# Patient Record
Sex: Male | Born: 2000 | Race: White | Hispanic: Yes | Marital: Single | Smoking: Current every day smoker
Health system: Southern US, Community
[De-identification: ages and names within clinical notes are randomized; demographics above are authoritative.]

## PROBLEM LIST (undated history)

## (undated) DIAGNOSIS — E669 Obesity, unspecified: Secondary | ICD-10-CM

## (undated) DIAGNOSIS — F909 Attention-deficit hyperactivity disorder, unspecified type: Secondary | ICD-10-CM

## (undated) HISTORY — PX: TONSILLECTOMY: SUR1361

---
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
## 2020-08-16 ENCOUNTER — Other Ambulatory Visit: Payer: Self-pay

## 2020-08-16 ENCOUNTER — Encounter (HOSPITAL_COMMUNITY): Payer: Self-pay

## 2020-08-16 ENCOUNTER — Ambulatory Visit (INDEPENDENT_AMBULATORY_CARE_PROVIDER_SITE_OTHER): Payer: Medicaid Other

## 2020-08-16 ENCOUNTER — Ambulatory Visit (HOSPITAL_COMMUNITY)
Admission: EM | Admit: 2020-08-16 | Discharge: 2020-08-16 | Disposition: A | Discharge status: Home / Self Care | Payer: Medicaid Other | Attending: Emergency Medicine | Admitting: Emergency Medicine

## 2020-08-16 DIAGNOSIS — S61402A Unspecified open wound of left hand, initial encounter: Secondary | ICD-10-CM | POA: Diagnosis not present

## 2020-08-16 DIAGNOSIS — T148XXA Other injury of unspecified body region, initial encounter: Secondary | ICD-10-CM

## 2020-08-16 DIAGNOSIS — S61432A Puncture wound without foreign body of left hand, initial encounter: Secondary | ICD-10-CM | POA: Diagnosis not present

## 2020-08-16 DIAGNOSIS — W60XXXA Contact with nonvenomous plant thorns and spines and sharp leaves, initial encounter: Secondary | ICD-10-CM | POA: Diagnosis not present

## 2020-08-16 MED ORDER — IBUPROFEN 800 MG PO TABS
800.0000 mg | ORAL_TABLET | Freq: Once | ORAL | Status: AC
  Administered 2020-08-16: 800 mg via ORAL

## 2020-08-16 MED ORDER — IBUPROFEN 800 MG PO TABS
ORAL_TABLET | ORAL | Status: AC
  Filled 2020-08-16: qty 1

## 2020-08-16 MED ORDER — AMOXICILLIN-POT CLAVULANATE 875-125 MG PO TABS: 1.0000 | ORAL_TABLET | Freq: Two times a day (BID) | ORAL | 0 refills | Status: DC

## 2020-08-16 NOTE — Discharge Instructions (Signed)
Keep steri strips on as long as possible, ideally 5 days at least.  They will eventually peel off on their own.  May get wet but do not soak, if wet pat dry and allow to air dry before covering again.   Once they are off cleanse daily with soap and water and then keep covered to keep clean.  Follow up with Dr. Amanda Pea for any worsening of symptoms, or return if needed.  Complete course of antibiotics.

## 2020-08-16 NOTE — ED Triage Notes (Signed)
Pt reports being punctured by a tree branch today. Pt presents with a puncture wound to the left palm. Pt states he felts his left arms numb. Pt states he feels a stinging sensation.

## 2020-08-16 NOTE — ED Provider Notes (Signed)
[facility]    CSN: [id number] Arrival date & time: 08/16/20  1522      History   Chief Complaint Chief Complaint  Patient presents with  . Puncture Wound    HPI [name] is a 20 y.o. male.   [name] presents with complaints of puncture wounds to left palm of hand. While playing with his dog in the back yard he reached his hand around, accidentally impaling it with a tree branch he didn't see. He indicates he feels it penetrated approximately 1 cm in depth. He had to pull it out. Uncertain if any broke off into the palm. Pain caused him to vomit. Full ROM to left hand and thumb still. No active bleeding. States he has had a Tdap in the past 5 years. Has had left hand and carpal tunnel release with I&D in the past with dr. Amanda Pea.     ROS per HPI, negative if not otherwise mentioned.      Past Medical History:  Diagnosis Date  . Attention deficit hyperactivity disorder (ADHD)    followed by Dr. Inda Coke  . Obesity     Patient Active Problem List   Diagnosis Date Noted  . Tinea corporis 10/08/2017  . Inguinal lymphadenopathy 01/13/2015  . Sleep disorder 05/22/2013  . Obesity 04/01/2013  . Attention deficit hyperactivity disorder (ADHD)     Past Surgical History:  Procedure Laterality Date  . NERVE, TENDON AND ARTERY REPAIR Left 04/08/2013   Procedure: Incision and drainage of left hand wound, left carpal tunnel release, hook of hamate excision;  Surgeon: Dominica Severin, MD;  Location: [facility] OR;  Service: Orthopedics;  Laterality: Left;  . TONSILLECTOMY         Home Medications    Prior to Admission medications   Medication Sig Start Date End Date Taking? Authorizing Provider  amoxicillin-clavulanate (AUGMENTIN) 875-125 MG tablet Take 1 tablet by mouth every 12 (twelve) hours. 08/16/20  Yes Fitz Matsuo, Dorene Grebe B, NP  cloNIDine HCl (KAPVAY) 0.1 MG TB12 ER tablet Take 1 tab by mouth every evening and 1 tab every morning.  After 7 days may increase  to 2 tabs qhs and 1 tab qam Patient not taking: Reported on 10/08/2017 11/16/15   Leatha Gilding, MD  ibuprofen (ADVIL,MOTRIN) 400 MG tablet Take 1 tablet (400 mg total) by mouth every 6 (six) hours as needed. Patient not taking: Reported on 10/08/2017 12/22/15   Ward, Chase Picket, PA-C  Lisdexamfetamine Dimesylate (VYVANSE) 10 MG CAPS Take one cap by mouth every morning Patient not taking: Reported on 10/08/2017 11/18/15   Leatha Gilding, MD  ondansetron (ZOFRAN ODT) 8 MG disintegrating tablet Take 1 tablet (8 mg total) by mouth every 8 (eight) hours as needed for nausea or vomiting. Patient not taking: Reported on 10/08/2017 08/09/17   Hollice Gong, MD  SKLICE 0.5 % LOTN Use as directed Patient not taking: Reported on 10/08/2017 03/10/16   Clint Guy, MD    Family History Family History  Problem Relation Age of Onset  . Diabetes Other     Social History Social History   Tobacco Use  . Smoking status: Passive Smoke Exposure - Never Smoker  . Smokeless tobacco: Never Used  . Tobacco comment: Parents are smokers.  Substance Use Topics  . Alcohol use: No    Alcohol/week: 0.0 standard drinks  . Drug use: No    Comment: Per Hx: Pt has experimented in the past with marijuana and tobacco  Allergies   Patient has no known allergies.   Review of Systems Review of Systems   Physical Exam Triage Vital Signs ED Triage Vitals  Enc Vitals Group     BP 08/16/20 1612 115/62     Pulse Rate 08/16/20 1612 79     Resp 08/16/20 1612 20     Temp 08/16/20 1612 98.9 F (37.2 C)     Temp Source 08/16/20 1612 Oral     SpO2 08/16/20 1612 100 %     Weight --      Height --      Head Circumference --      Peak Flow --      Pain Score 08/16/20 1611 5     Pain Loc --      Pain Edu? --      Excl. in GC? --    No data found.  Updated Vital Signs BP 115/62 (BP Location: Right Arm)   Pulse 79   Temp 98.9 F (37.2 C) (Oral)   Resp 20   SpO2 100%   Visual Acuity Right Eye  Distance:   Left Eye Distance:   Bilateral Distance:    Right Eye Near:   Left Eye Near:    Bilateral Near:     Physical Exam Constitutional:      Appearance: He is well-developed.  Cardiovascular:     Rate and Rhythm: Normal rate.  Pulmonary:     Effort: Pulmonary effort is normal.  Musculoskeletal:     Left hand: Laceration present.     Comments: Left palm with puncture wound, approximately 0.5 in length and 4mm in depth with close approximation; no active bleeding; full ROM of left thumb; no visible or palpable foreign body in wound; pinpoint sliver just proximal to this wound, which is easily removed with 25g needle with complete removal; area soaked and thoroughly irrigated with cleansing spray; sensation intact distal to wound and to thumb   Skin:    General: Skin is warm and dry.  Neurological:     Mental Status: He is alert and oriented to person, place, and time.      UC Treatments / Results  Labs (all labs ordered are listed, but only abnormal results are displayed) Labs Reviewed - No data to display  EKG   Radiology DG Hand Complete Left  Result Date: 08/16/2020 CLINICAL DATA:  Tree branch puncture wound to the palm near the thenar eminence. Evaluate for foreign body. EXAM: LEFT HAND - COMPLETE 3+ VIEW COMPARISON:  Left hand x-rays dated April 08, 2013. FINDINGS: There is no evidence of fracture or dislocation. There is no evidence of arthropathy or other focal bone abnormality. Soft tissues are unremarkable. No radiopaque foreign body identified. IMPRESSION: Negative. Electronically Signed   By: Obie Dredge M.D.   On: 08/16/2020 17:14    Procedures Laceration Repair  Date/Time: 08/16/2020 5:45 PM Performed by: Georgetta Haber, NP Authorized by: Georgetta Haber, NP   Consent:    Consent obtained:  Verbal   Consent given by:  Patient   Risks, benefits, and alternatives were discussed: yes     Risks discussed:  Infection, need for additional repair,  pain, retained foreign body, tendon damage, vascular damage and nerve damage   Alternatives discussed:  No treatment, observation and referral Universal protocol:    Procedure explained and questions answered to patient or proxy's satisfaction: yes     Imaging studies available: yes   Anesthesia:    Anesthesia method:  None Laceration details:    Location:  Hand   Hand location:  L palm   Length (cm):  0.5   Depth (mm):  3 Exploration:    Limited defect created (wound extended): yes     Imaging obtained: x-ray     Imaging outcome: foreign body not noted     Wound exploration: wound explored through full range of motion and entire depth of wound visualized     Wound extent: no foreign bodies/material noted, no tendon damage noted and no vascular damage noted     Wound extent comment:  No palpable or visible foreign bodies   Contaminated: yes   Treatment:    Area cleansed with:  Shur-Clens and soap and water   Amount of cleaning:  Extensive   Irrigation method:  Pressure wash   Foreign body removal: 1 sliver removed outside of the laceration/ puncture; no FB within the wound      Debridement:  None   Undermining:  None Skin repair:    Repair method:  Steri-Strips   Number of Steri-Strips:  5 Approximation:    Approximation:  Loose Repair type:    Repair type:  Simple Post-procedure details:    Dressing:  Bulky dressing Comments:     Steri strips applied due to smaller size of wound and still with some concern for possible foreign body which wasn't visible or irrigated out   (including critical care time)  Medications Ordered in UC Medications  ibuprofen (ADVIL) tablet 800 mg (800 mg Oral Given 08/16/20 1729)    Initial Impression / Assessment and Plan / UC Course  I have reviewed the triage vital signs and the nursing notes.  Pertinent labs & imaging results that were available during my care of the patient were reviewed by me and considered in my medical decision making  (see chart for details).     Puncture wound and additional small sliver removal. No visible foreign body to the puncture wound but still opted to close loosely with steri strips, although still pretty close approximation. Due to the puncture did cover with prophylactic antibiotics as well. Return precautions provided. Patient verbalized understanding and agreeable to plan.   Final Clinical Impressions(s) / UC Diagnoses   Final diagnoses:  Puncture wound     Discharge Instructions     Keep steri strips on as long as possible, ideally 5 days at least.  They will eventually peel off on their own.  May get wet but do not soak, if wet pat dry and allow to air dry before covering again.   Once they are off cleanse daily with soap and water and then keep covered to keep clean.  Follow up with Dr. Amanda Pea for any worsening of symptoms, or return if needed.  Complete course of antibiotics.      ED Prescriptions    Medication Sig Dispense Auth. Provider   amoxicillin-clavulanate (AUGMENTIN) 875-125 MG tablet Take 1 tablet by mouth every 12 (twelve) hours. 14 tablet Georgetta Haber, NP     PDMP not reviewed this encounter.   Georgetta Haber, NP 08/16/20 1751

## 2020-09-03 ENCOUNTER — Ambulatory Visit (HOSPITAL_COMMUNITY)
Admission: EM | Admit: 2020-09-03 | Discharge: 2020-09-03 | Disposition: A | Discharge status: Home / Self Care | Payer: Medicaid Other | Attending: Family Medicine | Admitting: Family Medicine

## 2020-09-03 ENCOUNTER — Other Ambulatory Visit: Payer: Self-pay

## 2020-09-03 ENCOUNTER — Encounter (HOSPITAL_COMMUNITY): Payer: Self-pay

## 2020-09-03 DIAGNOSIS — M25561 Pain in right knee: Secondary | ICD-10-CM

## 2020-09-03 MED ORDER — NAPROXEN 500 MG PO TABS: 500.0000 mg | ORAL_TABLET | Freq: Two times a day (BID) | ORAL | 0 refills | Status: DC

## 2020-09-03 NOTE — ED Notes (Signed)
Pt states needs to leave and does not want to wait to get xray. Provider advised.
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
## 2020-10-15 ENCOUNTER — Encounter (HOSPITAL_COMMUNITY): Payer: Self-pay

## 2020-10-15 ENCOUNTER — Emergency Department (HOSPITAL_COMMUNITY)
Admission: EM | Admit: 2020-10-15 | Discharge: 2020-10-15 | Disposition: A | Discharge status: Home / Self Care | Payer: Medicaid Other | Attending: Emergency Medicine | Admitting: Emergency Medicine

## 2020-10-15 ENCOUNTER — Ambulatory Visit (HOSPITAL_COMMUNITY)
Admission: EM | Admit: 2020-10-15 | Discharge: 2020-10-15 | Disposition: A | Discharge status: Home / Self Care | Payer: Medicaid Other

## 2020-10-15 ENCOUNTER — Other Ambulatory Visit: Payer: Self-pay

## 2020-10-15 DIAGNOSIS — S61253A Open bite of left middle finger without damage to nail, initial encounter: Secondary | ICD-10-CM | POA: Insufficient documentation

## 2020-10-15 DIAGNOSIS — Z23 Encounter for immunization: Secondary | ICD-10-CM | POA: Diagnosis not present

## 2020-10-15 DIAGNOSIS — S0591XA Unspecified injury of right eye and orbit, initial encounter: Secondary | ICD-10-CM | POA: Diagnosis present

## 2020-10-15 DIAGNOSIS — S0083XA Contusion of other part of head, initial encounter: Secondary | ICD-10-CM | POA: Diagnosis not present

## 2020-10-15 DIAGNOSIS — Z7722 Contact with and (suspected) exposure to environmental tobacco smoke (acute) (chronic): Secondary | ICD-10-CM | POA: Insufficient documentation

## 2020-10-15 DIAGNOSIS — S61250A Open bite of right index finger without damage to nail, initial encounter: Secondary | ICD-10-CM | POA: Diagnosis not present

## 2020-10-15 DIAGNOSIS — S0511XA Contusion of eyeball and orbital tissues, right eye, initial encounter: Secondary | ICD-10-CM | POA: Insufficient documentation

## 2020-10-15 DIAGNOSIS — S61259A Open bite of unspecified finger without damage to nail, initial encounter: Secondary | ICD-10-CM

## 2020-10-15 MED ORDER — TETANUS-DIPHTH-ACELL PERTUSSIS 5-2.5-18.5 LF-MCG/0.5 IM SUSY
0.5000 mL | PREFILLED_SYRINGE | Freq: Once | INTRAMUSCULAR | Status: AC
  Administered 2020-10-15: 0.5 mL via INTRAMUSCULAR
  Filled 2020-10-15: qty 0.5

## 2020-10-15 MED ORDER — AMOXICILLIN-POT CLAVULANATE 875-125 MG PO TABS: 1.0000 | ORAL_TABLET | Freq: Two times a day (BID) | ORAL | 0 refills | Status: DC
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
## 2020-10-15 NOTE — Discharge Instructions (Addendum)
Take Augmentin as prescribed and complete the full course.  Recommend wound check in 2 days.
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
## 2020-10-18 ENCOUNTER — Telehealth: Payer: Self-pay

## 2020-10-18 NOTE — Telephone Encounter (Signed)
Transition Care Management Unsuccessful Follow-up Telephone Call  Date of discharge and from where:  10/15/2020 from [place]  Attempts:  1st Attempt  Reason for unsuccessful TCM follow-up call:  Left voice message     

## 2020-10-19 NOTE — Telephone Encounter (Signed)
Transition Care Management Unsuccessful Follow-up Telephone Call  Date of discharge and from where:  10/15/2020 from [place]  Attempts:  2nd Attempt  Reason for unsuccessful TCM follow-up call:  Left voice message     

## 2020-10-20 NOTE — Telephone Encounter (Signed)
Transition Care Management Follow-up Telephone Call  Date of discharge and from where: 10/15/2020 from [facility]  How have you been since you were released from the hospital? Pt stated that he is doing well and did not have any questions. Pt stated that he had not picked up the abx that was rx'ed but did take left over abx that his grandmother had. Pt advised on the dangers of that and I also advised what to look for as far as signs of infection. Pt encouraged to pick up abx that was sent and to finish them completely. Pt also encouraged to call PCP if injury does not improve or if the area worsen.   Any questions or concerns? No  Items Reviewed:  Did the pt receive and understand the discharge instructions provided? Yes   Medications obtained and verified? Yes   Other? No   Any new allergies since your discharge? No   Dietary orders reviewed? n/a  Do you have support at home? Yes   Functional Questionnaire: (I = Independent and D = Dependent) ADLs: I  Bathing/Dressing- I  Meal Prep- I  Eating- I  Maintaining continence- I  Transferring/Ambulation- I  Managing Meds- I   Follow up appointments reviewed:   PCP Hospital f/u appt confirmed? No    Specialist Hospital f/u appt confirmed? No    Are transportation arrangements needed? No   If their condition worsens, is the pt aware to call PCP or go to the Emergency Dept.? Yes  Was the patient provided with contact information for the PCP's office or ED? Yes  Was to pt encouraged to call back with questions or concerns? Yes

## 2021-10-29 ENCOUNTER — Emergency Department (HOSPITAL_COMMUNITY): Payer: Medicaid Other

## 2021-10-29 ENCOUNTER — Other Ambulatory Visit: Payer: Self-pay

## 2021-10-29 ENCOUNTER — Emergency Department (EMERGENCY_DEPARTMENT_HOSPITAL): Payer: Medicaid Other | Admitting: Certified Registered Nurse Anesthetist

## 2021-10-29 ENCOUNTER — Emergency Department (HOSPITAL_COMMUNITY): Payer: Medicaid Other | Admitting: Certified Registered Nurse Anesthetist

## 2021-10-29 ENCOUNTER — Encounter (HOSPITAL_COMMUNITY)
Admission: EM | Disposition: A | Discharge status: Home / Self Care | Payer: Self-pay | Source: Home / Self Care | Attending: Emergency Medicine

## 2021-10-29 ENCOUNTER — Ambulatory Visit (HOSPITAL_COMMUNITY)
Admission: EM | Admit: 2021-10-29 | Discharge: 2021-10-29 | Disposition: A | Discharge status: Home / Self Care | Payer: Medicaid Other | Attending: Emergency Medicine | Admitting: Emergency Medicine

## 2021-10-29 ENCOUNTER — Encounter (HOSPITAL_COMMUNITY): Payer: Self-pay | Admitting: *Deleted

## 2021-10-29 DIAGNOSIS — R609 Edema, unspecified: Secondary | ICD-10-CM | POA: Diagnosis not present

## 2021-10-29 DIAGNOSIS — R52 Pain, unspecified: Secondary | ICD-10-CM | POA: Diagnosis not present

## 2021-10-29 DIAGNOSIS — R079 Chest pain, unspecified: Secondary | ICD-10-CM | POA: Diagnosis not present

## 2021-10-29 DIAGNOSIS — F909 Attention-deficit hyperactivity disorder, unspecified type: Secondary | ICD-10-CM | POA: Insufficient documentation

## 2021-10-29 DIAGNOSIS — S199XXA Unspecified injury of neck, initial encounter: Secondary | ICD-10-CM | POA: Diagnosis not present

## 2021-10-29 DIAGNOSIS — S93115A Dislocation of interphalangeal joint of left lesser toe(s), initial encounter: Secondary | ICD-10-CM

## 2021-10-29 DIAGNOSIS — S83005A Unspecified dislocation of left patella, initial encounter: Secondary | ICD-10-CM | POA: Diagnosis not present

## 2021-10-29 DIAGNOSIS — Z7722 Contact with and (suspected) exposure to environmental tobacco smoke (acute) (chronic): Secondary | ICD-10-CM | POA: Insufficient documentation

## 2021-10-29 DIAGNOSIS — F1290 Cannabis use, unspecified, uncomplicated: Secondary | ICD-10-CM | POA: Diagnosis not present

## 2021-10-29 DIAGNOSIS — M549 Dorsalgia, unspecified: Secondary | ICD-10-CM | POA: Diagnosis not present

## 2021-10-29 DIAGNOSIS — M25561 Pain in right knee: Secondary | ICD-10-CM | POA: Diagnosis not present

## 2021-10-29 DIAGNOSIS — Z9889 Other specified postprocedural states: Secondary | ICD-10-CM | POA: Diagnosis not present

## 2021-10-29 DIAGNOSIS — S93105A Unspecified dislocation of left toe(s), initial encounter: Secondary | ICD-10-CM | POA: Diagnosis present

## 2021-10-29 DIAGNOSIS — L03032 Cellulitis of left toe: Secondary | ICD-10-CM | POA: Insufficient documentation

## 2021-10-29 DIAGNOSIS — S99922A Unspecified injury of left foot, initial encounter: Secondary | ICD-10-CM | POA: Diagnosis not present

## 2021-10-29 DIAGNOSIS — R509 Fever, unspecified: Secondary | ICD-10-CM | POA: Diagnosis not present

## 2021-10-29 HISTORY — PX: ORIF TOE FRACTURE: SHX5032

## 2021-10-29 LAB — BASIC METABOLIC PANEL
Anion gap: 10 (ref 5–15)
BUN: 13 mg/dL (ref 6–20)
CO2: 23 mmol/L (ref 22–32)
Calcium: 9 mg/dL (ref 8.9–10.3)
Chloride: 106 mmol/L (ref 98–111)
Creatinine, Ser: 0.98 mg/dL (ref 0.61–1.24)
GFR, Estimated: >60 mL/min (ref >60)
Glucose, Bld: 101 mg/dL — ABNORMAL HIGH (ref 70–99)
Potassium: 3.1 mmol/L — ABNORMAL LOW (ref 3.5–5.1)
Sodium: 139 mmol/L (ref 135–145)

## 2021-10-29 SURGERY — OPEN REDUCTION INTERNAL FIXATION (ORIF) METATARSAL (TOE) FRACTURE
Anesthesia: General | Site: Toe | Laterality: Left

## 2021-10-29 MED ORDER — LACTATED RINGERS IV SOLN: INTRAVENOUS | Status: DC

## 2021-10-29 MED ORDER — MORPHINE SULFATE (PF) 4 MG/ML IV SOLN
4.0000 mg | Freq: Once | INTRAVENOUS | Status: AC
  Administered 2021-10-29: 4 mg via INTRAVENOUS
  Filled 2021-10-29: qty 1

## 2021-10-29 MED ORDER — PROPOFOL 10 MG/ML IV BOLUS
INTRAVENOUS | Status: DC | PRN
  Administered 2021-10-29: 200 mg via INTRAVENOUS

## 2021-10-29 MED ORDER — CEFAZOLIN SODIUM-DEXTROSE 2-3 GM-%(50ML) IV SOLR
INTRAVENOUS | Status: DC | PRN
  Administered 2021-10-29: 2 g via INTRAVENOUS

## 2021-10-29 MED ORDER — ONDANSETRON HCL 4 MG/2ML IJ SOLN
INTRAMUSCULAR | Status: DC | PRN
  Administered 2021-10-29: 4 mg via INTRAVENOUS

## 2021-10-29 MED ORDER — MIDAZOLAM HCL 2 MG/2ML IJ SOLN
INTRAMUSCULAR | Status: AC
  Filled 2021-10-29: qty 2

## 2021-10-29 MED ORDER — ORAL CARE MOUTH RINSE: 15.0000 mL | Freq: Once | OROMUCOSAL | Status: AC

## 2021-10-29 MED ORDER — PHENYLEPHRINE 80 MCG/ML (10ML) SYRINGE FOR IV PUSH (FOR BLOOD PRESSURE SUPPORT)
PREFILLED_SYRINGE | INTRAVENOUS | Status: DC | PRN
  Administered 2021-10-29: 40 ug via INTRAVENOUS

## 2021-10-29 MED ORDER — CHLORHEXIDINE GLUCONATE 0.12 % MT SOLN: 15.0000 mL | Freq: Once | OROMUCOSAL | Status: AC

## 2021-10-29 MED ORDER — LIDOCAINE HCL 2 % IJ SOLN: 20.0000 mL | Freq: Once | INTRAMUSCULAR | Status: DC

## 2021-10-29 MED ORDER — OXYCODONE-ACETAMINOPHEN 5-325 MG PO TABS: 1.0000 | ORAL_TABLET | Freq: Two times a day (BID) | ORAL | 0 refills | Status: AC | PRN

## 2021-10-29 MED ORDER — MIDAZOLAM HCL 2 MG/2ML IJ SOLN
INTRAMUSCULAR | Status: DC | PRN
  Administered 2021-10-29: 2 mg via INTRAVENOUS

## 2021-10-29 MED ORDER — LIDOCAINE 2% (20 MG/ML) 5 ML SYRINGE
INTRAMUSCULAR | Status: DC | PRN
  Administered 2021-10-29: 60 mg via INTRAVENOUS

## 2021-10-29 MED ORDER — 0.9 % SODIUM CHLORIDE (POUR BTL) OPTIME
TOPICAL | Status: DC | PRN
  Administered 2021-10-29: 1000 mL

## 2021-10-29 MED ORDER — METHOCARBAMOL 500 MG PO TABS
500.0000 mg | ORAL_TABLET | Freq: Three times a day (TID) | ORAL | 0 refills | Status: DC | PRN
Start: 2021-10-29 — End: 2022-12-12

## 2021-10-29 MED ORDER — BUPIVACAINE HCL (PF) 0.25 % IJ SOLN
INTRAMUSCULAR | Status: AC
  Filled 2021-10-29: qty 30

## 2021-10-29 MED ORDER — KETOROLAC TROMETHAMINE 10 MG PO TABS: 10.0000 mg | ORAL_TABLET | Freq: Four times a day (QID) | ORAL | 0 refills | Status: DC | PRN

## 2021-10-29 MED ORDER — AMOXICILLIN-POT CLAVULANATE 875-125 MG PO TABS
1.0000 | ORAL_TABLET | Freq: Two times a day (BID) | ORAL | 0 refills | Status: AC
Start: 2021-10-29 — End: 2021-11-03

## 2021-10-29 MED ORDER — HYDROMORPHONE HCL 1 MG/ML IJ SOLN: 0.2500 mg | INTRAMUSCULAR | Status: DC | PRN

## 2021-10-29 MED ORDER — LIDOCAINE HCL (PF) 1 % IJ SOLN
5.0000 mL | Freq: Once | INTRAMUSCULAR | Status: AC
  Administered 2021-10-29: 5 mL
  Filled 2021-10-29: qty 5

## 2021-10-29 MED ORDER — FENTANYL CITRATE (PF) 250 MCG/5ML IJ SOLN
INTRAMUSCULAR | Status: DC | PRN
  Administered 2021-10-29 (×3): 50 ug via INTRAVENOUS
  Administered 2021-10-29: 25 ug via INTRAVENOUS

## 2021-10-29 MED ORDER — PROPOFOL 10 MG/ML IV BOLUS
INTRAVENOUS | Status: AC
  Filled 2021-10-29: qty 20

## 2021-10-29 MED ORDER — DEXAMETHASONE SODIUM PHOSPHATE 10 MG/ML IJ SOLN
INTRAMUSCULAR | Status: DC | PRN
  Administered 2021-10-29: 10 mg via INTRAVENOUS

## 2021-10-29 MED ORDER — CHLORHEXIDINE GLUCONATE 0.12 % MT SOLN
OROMUCOSAL | Status: AC
  Administered 2021-10-29: 15 mL via OROMUCOSAL
  Filled 2021-10-29: qty 15

## 2021-10-29 MED ORDER — OXYCODONE-ACETAMINOPHEN 5-325 MG PO TABS
1.0000 | ORAL_TABLET | Freq: Once | ORAL | Status: AC
  Administered 2021-10-29: 1 via ORAL
  Filled 2021-10-29: qty 1

## 2021-10-29 MED ORDER — ONDANSETRON 4 MG PO TBDP: 4.0000 mg | ORAL_TABLET | Freq: Three times a day (TID) | ORAL | 0 refills | Status: DC | PRN

## 2021-10-29 MED ORDER — FENTANYL CITRATE (PF) 250 MCG/5ML IJ SOLN
INTRAMUSCULAR | Status: AC
  Filled 2021-10-29: qty 5

## 2021-10-29 SURGICAL SUPPLY — 72 items
BAG COUNTER SPONGE SURGICOUNT (BAG) ×3 IMPLANT
BANDAGE ESMARK 6X9 LF (GAUZE/BANDAGES/DRESSINGS) ×2 IMPLANT
BLADE SURG 10 STRL SS (BLADE) ×3 IMPLANT
BNDG COHESIVE 4X5 TAN STRL (GAUZE/BANDAGES/DRESSINGS) ×3 IMPLANT
BNDG ELASTIC 2 VLCR STRL LF (GAUZE/BANDAGES/DRESSINGS) ×2 IMPLANT
BNDG ELASTIC 4X5.8 VLCR STR LF (GAUZE/BANDAGES/DRESSINGS) IMPLANT
BNDG ELASTIC 6X5.8 VLCR STR LF (GAUZE/BANDAGES/DRESSINGS) IMPLANT
BNDG ESMARK 6X9 LF (GAUZE/BANDAGES/DRESSINGS) ×3
BNDG GAUZE ELAST 4 BULKY (GAUZE/BANDAGES/DRESSINGS) ×4 IMPLANT
BRUSH SCRUB EZ PLAIN DRY (MISCELLANEOUS) ×6 IMPLANT
COVER SURGICAL LIGHT HANDLE (MISCELLANEOUS) ×6 IMPLANT
DRAPE C-ARM 42X72 X-RAY (DRAPES) IMPLANT
DRAPE C-ARMOR (DRAPES) ×3 IMPLANT
DRAPE U-SHAPE 47X51 STRL (DRAPES) ×3 IMPLANT
DRSG ADAPTIC 3X8 NADH LF (GAUZE/BANDAGES/DRESSINGS) ×3 IMPLANT
DRSG EMULSION OIL 3X3 NADH (GAUZE/BANDAGES/DRESSINGS) IMPLANT
ELECT REM PT RETURN 9FT ADLT (ELECTROSURGICAL) ×3
ELECTRODE REM PT RTRN 9FT ADLT (ELECTROSURGICAL) ×2 IMPLANT
GAUZE SPONGE 4X4 12PLY STRL (GAUZE/BANDAGES/DRESSINGS) ×3 IMPLANT
GAUZE SPONGE 4X4 12PLY STRL LF (GAUZE/BANDAGES/DRESSINGS) ×2 IMPLANT
GLOVE BIO SURGEON STRL SZ7.5 (GLOVE) ×3 IMPLANT
GLOVE BIO SURGEON STRL SZ8 (GLOVE) ×3 IMPLANT
GLOVE BIOGEL PI IND STRL 7.5 (GLOVE) ×2 IMPLANT
GLOVE BIOGEL PI IND STRL 8 (GLOVE) ×2 IMPLANT
GLOVE BIOGEL PI IND STRL 9 (GLOVE) ×2 IMPLANT
GLOVE BIOGEL PI INDICATOR 7.5 (GLOVE) ×1
GLOVE BIOGEL PI INDICATOR 8 (GLOVE) ×1
GLOVE BIOGEL PI INDICATOR 9 (GLOVE) ×1
GLOVE SURG ORTHO LTX SZ7.5 (GLOVE) ×6 IMPLANT
GOWN STRL REUS W/ TWL LRG LVL3 (GOWN DISPOSABLE) ×4 IMPLANT
GOWN STRL REUS W/ TWL XL LVL3 (GOWN DISPOSABLE) ×2 IMPLANT
GOWN STRL REUS W/TWL LRG LVL3 (GOWN DISPOSABLE) ×2
GOWN STRL REUS W/TWL XL LVL3 (GOWN DISPOSABLE) ×1
HANDPIECE INTERPULSE COAX TIP (DISPOSABLE)
KIT BASIN OR (CUSTOM PROCEDURE TRAY) ×3 IMPLANT
KIT TURNOVER KIT B (KITS) ×3 IMPLANT
MANIFOLD NEPTUNE II (INSTRUMENTS) ×3 IMPLANT
NDL HYPO 21X1.5 SAFETY (NEEDLE) IMPLANT
NEEDLE HYPO 21X1.5 SAFETY (NEEDLE) IMPLANT
NS IRRIG 1000ML POUR BTL (IV SOLUTION) ×3 IMPLANT
PACK GENERAL/GYN (CUSTOM PROCEDURE TRAY) ×3 IMPLANT
PACK ORTHO EXTREMITY (CUSTOM PROCEDURE TRAY) ×3 IMPLANT
PAD ARMBOARD 7.5X6 YLW CONV (MISCELLANEOUS) ×6 IMPLANT
PAD CAST 4YDX4 CTTN HI CHSV (CAST SUPPLIES) IMPLANT
PADDING CAST COTTON 4X4 STRL (CAST SUPPLIES)
PADDING CAST COTTON 6X4 STRL (CAST SUPPLIES) ×3 IMPLANT
PENCIL BUTTON HOLSTER BLD 10FT (ELECTRODE) ×3 IMPLANT
SET HNDPC FAN SPRY TIP SCT (DISPOSABLE) IMPLANT
SOL PREP POV-IOD 4OZ 10% (MISCELLANEOUS) ×3 IMPLANT
SOL SCRUB PVP POV-IOD 4OZ 7.5% (MISCELLANEOUS) ×3
SOLUTION SCRB POV-IOD 4OZ 7.5% (MISCELLANEOUS) ×2 IMPLANT
SPONGE T-LAP 18X18 ~~LOC~~+RFID (SPONGE) ×9 IMPLANT
STAPLER VISISTAT 35W (STAPLE) IMPLANT
STOCKINETTE IMPERVIOUS 9X36 MD (GAUZE/BANDAGES/DRESSINGS) IMPLANT
SUCTION FRAZIER HANDLE 10FR (MISCELLANEOUS) ×1
SUCTION TUBE FRAZIER 10FR DISP (MISCELLANEOUS) ×2 IMPLANT
SUT ETHILON 2 0 FS 18 (SUTURE) ×15 IMPLANT
SUT ETHILON 3 0 PS 1 (SUTURE) ×2 IMPLANT
SUT FIBERWIRE 2-0 18 17.9 3/8 (SUTURE) ×3
SUT PDS AB 2-0 CT1 27 (SUTURE) IMPLANT
SUT VIC AB 2-0 CT1 27 (SUTURE) ×4
SUT VIC AB 2-0 CT1 TAPERPNT 27 (SUTURE) ×4 IMPLANT
SUT VIC AB 2-0 CT3 27 (SUTURE) IMPLANT
SUT VIC AB 4-0 PS2 18 (SUTURE) ×2 IMPLANT
SUTURE FIBERWR 2-0 18 17.9 3/8 (SUTURE) ×1 IMPLANT
SYR CONTROL 10ML LL (SYRINGE) IMPLANT
TOWEL GREEN STERILE (TOWEL DISPOSABLE) ×6 IMPLANT
TOWEL GREEN STERILE FF (TOWEL DISPOSABLE) ×3 IMPLANT
TUBE CONNECTING 12X1/4 (SUCTIONS) ×3 IMPLANT
UNDERPAD 30X36 HEAVY ABSORB (UNDERPADS AND DIAPERS) ×3 IMPLANT
WATER STERILE IRR 1000ML POUR (IV SOLUTION) ×3 IMPLANT
YANKAUER SUCT BULB TIP NO VENT (SUCTIONS) ×3 IMPLANT
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
## 2021-10-29 NOTE — Progress Notes (Signed)
Orthopedic Tech Progress Note Patient Details:  [name] 2000-11-02 [id number]  Ortho Devices Type of Ortho Device: Crutches, Postop shoe/boot Ortho Device/Splint Interventions: Ordered      Genelle Bal Kass Herberger 10/29/2021, 4:59 PM

## 2021-10-29 NOTE — Anesthesia Procedure Notes (Signed)
Procedure Name: LMA Insertion Date/Time: 10/29/2021 2:22 PM Performed by: Lelon Perla, CRNA Pre-anesthesia Checklist: Patient identified, Emergency Drugs available, Suction available and Patient being monitored Patient Re-evaluated:Patient Re-evaluated prior to induction Oxygen Delivery Method: Circle System Utilized Preoxygenation: Pre-oxygenation with 100% oxygen Induction Type: IV induction Ventilation: Mask ventilation without difficulty LMA: LMA inserted LMA Size: 4.0 Number of attempts: 1 Airway Equipment and Method: Bite block Placement Confirmation: positive ETCO2 Tube secured with: Tape Dental Injury: Teeth and Oropharynx as per pre-operative assessment

## 2021-10-29 NOTE — Transfer of Care (Signed)
Immediate Anesthesia Transfer of Care Note  Patient: [name]  Procedure(s) Performed: ATTEMPTED CLOSED REDUCTION OF 2ND TOE;    OPEN REDUCTION INTERNAL FIXATION (ORIF) PHALANGEAL 2nd (TOE) FRACTURE. iRRIGATION AND DEBRIDEMENT OF LEFT GREAT TOE (Left: Toe)  Patient Location: PACU  Anesthesia Type:General  Level of Consciousness: drowsy  Airway & Oxygen Therapy: Patient Spontanous Breathing and Patient connected to face mask oxygen  Post-op Assessment: Report given to RN and Post -op Vital signs reviewed and stable  Post vital signs: Reviewed and stable  Last Vitals:  Vitals Value Taken Time  BP 104/59 10/29/21 1520  Temp    Pulse 58 10/29/21 1521  Resp 15 10/29/21 1521  SpO2 100 % 10/29/21 1521  Vitals shown include unvalidated device data.  Last Pain:  Vitals:   10/29/21 1207  TempSrc: Oral  PainSc:          Complications: No notable events documented.
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
## 2021-10-29 NOTE — Anesthesia Preprocedure Evaluation (Addendum)
Anesthesia Evaluation  Patient identified by MRN, date of birth, ID band Patient awake    Reviewed: Allergy & Precautions, H&P , NPO status , Patient's Chart, lab work & pertinent test results  Airway Mallampati: II  TM Distance: >3 FB Neck ROM: Full    Dental no notable dental hx. (+) Teeth Intact, Dental Advisory Given   Pulmonary neg pulmonary ROS,    Pulmonary exam normal breath sounds clear to auscultation       Cardiovascular negative cardio ROS   Rhythm:Regular Rate:Normal     Neuro/Psych negative neurological ROS  negative psych ROS   GI/Hepatic negative GI ROS, Neg liver ROS,   Endo/Other  negative endocrine ROS  Renal/GU negative Renal ROS  negative genitourinary   Musculoskeletal   Abdominal   Peds  (+) ADHD Hematology negative hematology ROS (+)   Anesthesia Other Findings   Reproductive/Obstetrics negative OB ROS                            Anesthesia Physical Anesthesia Plan  ASA: 1  Anesthesia Plan: General   Post-op Pain Management:    Induction: Intravenous, Rapid sequence and Cricoid pressure planned  PONV Risk Score and Plan: 3 and Ondansetron, Dexamethasone and Midazolam  Airway Management Planned: Oral ETT  Additional Equipment:   Intra-op Plan:   Post-operative Plan: Extubation in OR  Informed Consent: I have reviewed the patients History and Physical, chart, labs and discussed the procedure including the risks, benefits and alternatives for the proposed anesthesia with the patient or authorized representative who has indicated his/her understanding and acceptance.     Dental advisory given  Plan Discussed with: CRNA  Anesthesia Plan Comments:         Anesthesia Quick Evaluation
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
## 2021-10-29 NOTE — ED Provider Notes (Signed)
.  Nerve Block  Date/Time: 10/29/2021 11:00 AM Performed by: Carlisle Cater, PA-C Authorized by: Carlisle Cater, PA-C   Consent:    Consent obtained:  Verbal   Consent given by:  Patient   Risks discussed:  Infection, pain, unsuccessful block and bleeding Universal protocol:    Patient identity confirmed:  Verbally with patient and provided demographic data Indications:    Indications:  Pain relief Location:    Body area:  Lower extremity (Left 2nd toe)   Laterality:  Left Pre-procedure details:    Skin preparation:  Povidone-iodine Procedure details:    Block needle gauge:  27 G   Anesthetic injected:  Lidocaine 1% w/o epi   Steroid injected:  None   Injection procedure:  Anatomic landmarks identified, incremental injection, negative aspiration for blood, anatomic landmarks palpated and introduced needle   Paresthesia:  Immediately resolved Comments:     3-sided ring block of toe, successful, prior to reduction attempts    Carlisle Cater, PA-C 10/29/21 [place]    Valarie Merino, MD 11/01/21 1050
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
## 2021-10-29 NOTE — Anesthesia Postprocedure Evaluation (Signed)
Anesthesia Post Note  Patient: [name]  Procedure(s) Performed: ATTEMPTED CLOSED REDUCTION OF 2ND TOE;    OPEN REDUCTION INTERNAL FIXATION (ORIF) PHALANGEAL 2nd (TOE) FRACTURE. iRRIGATION AND DEBRIDEMENT OF LEFT GREAT TOE (Left: Toe)     Patient location during evaluation: PACU Anesthesia Type: General Level of consciousness: awake and alert Pain management: pain level controlled Vital Signs Assessment: post-procedure vital signs reviewed and stable Respiratory status: spontaneous breathing, nonlabored ventilation and respiratory function stable Cardiovascular status: blood pressure returned to baseline and stable Postop Assessment: no apparent nausea or vomiting Anesthetic complications: no   No notable events documented.  Last Vitals:  Vitals:   10/29/21 1535 10/29/21 1550  BP: 106/62 127/81  Pulse: (!) 58 76  Resp: 14 14  Temp:  36.9 C  SpO2: 97% 99%    Last Pain:  Vitals:   10/29/21 1550  TempSrc:   PainSc: 0-No pain                 Rushie Brazel,W. EDMOND

## 2021-10-29 NOTE — Discharge Instructions (Addendum)
Orthopaedic Discharge instructions   Weightbear as tolerated with post op shoe and crutches  Ice and elevate foot for swelling and pain control Leave dressing in place until follow up appointment Schedule a follow up appointment for 11/02/2021 Call [phone]    Orthopaedic Trauma Service Discharge Instructions   General Discharge Instructions  Diet: as you were eating previously.  Can use over the counter stool softeners and bowel preparations, such as Miralax, to help with bowel movements.  Narcotics can be constipating.  Be sure to drink plenty of fluids  PAIN MEDICATION USE AND EXPECTATIONS  You have likely been given narcotic medications to help control your pain.  After a traumatic event that results in an fracture (broken bone) with or without surgery, it is ok to use narcotic pain medications to help control one's pain.  We understand that everyone responds to pain differently and each individual patient will be evaluated on a regular basis for the continued need for narcotic medications. Ideally, narcotic medication use should last no more than 6-8 weeks (coinciding with fracture healing).   As a patient it is your responsibility as well to monitor narcotic medication use and report the amount and frequency you use these medications when you come to your office visit.   We would also advise that if you are using narcotic medications, you should take a dose prior to therapy to maximize you participation.  IF YOU ARE ON NARCOTIC MEDICATIONS IT IS NOT PERMISSIBLE TO OPERATE A MOTOR VEHICLE (MOTORCYCLE/CAR/TRUCK/MOPED) OR HEAVY MACHINERY DO NOT MIX NARCOTICS WITH OTHER CNS (CENTRAL NERVOUS SYSTEM) DEPRESSANTS SUCH AS ALCOHOL   POST-OPERATIVE OPIOID TAPER INSTRUCTIONS: It is important to wean off of your opioid medication as soon as possible. If you do not need pain medication after your surgery it is ok to stop day one. Opioids include: Codeine, Hydrocodone(Norco, Vicodin),  Oxycodone(Percocet, oxycontin) and hydromorphone amongst others.  Long term and even short term use of opiods can cause: Increased pain response Dependence Constipation Depression Respiratory depression And more.  Withdrawal symptoms can include Flu like symptoms Nausea, vomiting And more Techniques to manage these symptoms Hydrate well Eat regular healthy meals Stay active Use relaxation techniques(deep breathing, meditating, yoga) Do Not substitute Alcohol to help with tapering If you have been on opioids for less than two weeks and do not have pain than it is ok to stop all together.  Plan to wean off of opioids This plan should start within one week post op of your fracture surgery  Maintain the same interval or time between taking each dose and first decrease the dose.  Cut the total daily intake of opioids by one tablet each day Next start to increase the time between doses. The last dose that should be eliminated is the evening dose.    STOP SMOKING OR USING NICOTINE PRODUCTS!!!!  As discussed nicotine severely impairs your body's ability to heal surgical and traumatic wounds but also impairs bone healing.  Wounds and bone heal by forming microscopic blood vessels (angiogenesis) and nicotine is a vasoconstrictor (essentially, shrinks blood vessels).  Therefore, if vasoconstriction occurs to these microscopic blood vessels they essentially disappear and are unable to deliver necessary nutrients to the healing tissue.  This is one modifiable factor that you can do to dramatically increase your chances of healing your injury.    (This means no smoking, no nicotine gum, patches, etc)  DO NOT USE NONSTEROIDAL ANTI-INFLAMMATORY DRUGS (NSAID'S)  Using products such as Advil (ibuprofen), Aleve (naproxen), Motrin (ibuprofen)  for additional pain control during fracture healing can delay and/or prevent the healing response.  If you would like to take over the counter (OTC) medication,  Tylenol (acetaminophen) is ok.  However, some narcotic medications that are given for pain control contain acetaminophen as well. Therefore, you should not exceed more than 4000 mg of tylenol in a day if you do not have liver disease.  Also note that there are may OTC medicines, such as cold medicines and allergy medicines that my contain tylenol as well.  If you have any questions about medications and/or interactions please ask your doctor/PA or your pharmacist.      ICE AND ELEVATE INJURED/OPERATIVE EXTREMITY  Using ice and elevating the injured extremity above your heart can help with swelling and pain control.  Icing in a pulsatile fashion, such as 20 minutes on and 20 minutes off, can be followed.    Do not place ice directly on skin. Make sure there is a barrier between to skin and the ice pack.    Using frozen items such as frozen peas works well as the conform nicely to the are that needs to be iced.  USE AN ACE WRAP OR TED HOSE FOR SWELLING CONTROL  In addition to icing and elevation, Ace wraps or TED hose are used to help limit and resolve swelling.  It is recommended to use Ace wraps or TED hose until you are informed to stop.    When using Ace Wraps start the wrapping distally (farthest away from the body) and wrap proximally (closer to the body)   Example: If you had surgery on your leg or thing and you do not have a splint on, start the ace wrap at the toes and work your way up to the thigh        If you had surgery on your upper extremity and do not have a splint on, start the ace wrap at your fingers and work your way up to the upper arm  IF YOU ARE IN A SPLINT OR CAST DO NOT REMOVE IT FOR ANY REASON   If your splint gets wet for any reason please contact the office immediately. You may shower in your splint or cast as long as you keep it dry.  This can be done by wrapping in a cast cover or garbage back (or similar)  Do Not stick any thing down your splint or cast such as pencils,  money, or hangers to try and scratch yourself with.  If you feel itchy take benadryl as prescribed on the bottle for itching  IF YOU ARE IN A CAM BOOT (BLACK BOOT)  You may remove boot periodically. Perform daily dressing changes as noted below.  Wash the liner of the boot regularly and wear a sock when wearing the boot. It is recommended that you sleep in the boot until told otherwise    Call office for the following: Temperature greater than 101F Persistent nausea and vomiting Severe uncontrolled pain Redness, tenderness, or signs of infection (pain, swelling, redness, odor or green/yellow discharge around the site) Difficulty breathing, headache or visual disturbances Hives Persistent dizziness or light-headedness Extreme fatigue Any other questions or concerns you may have after discharge  In an emergency, call 911 or go to an Emergency Department at a nearby hospital  HELPFUL INFORMATION  If you had a block, it will wear off between 8-24 hrs postop typically.  This is period when your pain may go from nearly zero to the  pain you would have had postop without the block.  This is an abrupt transition but nothing dangerous is happening.  You may take an extra dose of narcotic when this happens.  You should wean off your narcotic medicines as soon as you are able.  Most patients will be off or using minimal narcotics before their first postop appointment.   We suggest you use the pain medication the first night prior to going to bed, in order to ease any pain when the anesthesia wears off. You should avoid taking pain medications on an empty stomach as it will make you nauseous.  Do not drink alcoholic beverages or take illicit drugs when taking pain medications.  In most states it is against the law to drive while you are in a splint or sling.  And certainly against the law to drive while taking narcotics.  You may return to work/school in the next couple of days when you feel up to  it.   Pain medication may make you constipated.  Below are a few solutions to try in this order: Decrease the amount of pain medication if you aren't having pain. Drink lots of decaffeinated fluids. Drink prune juice and/or each dried prunes  If the first 3 don't work start with additional solutions Take Colace - an over-the-counter stool softener Take Senokot - an over-the-counter laxative Take Miralax - a stronger over-the-counter laxative     CALL THE OFFICE WITH ANY QUESTIONS OR CONCERNS: [phone]   VISIT OUR WEBSITE FOR ADDITIONAL INFORMATION: [email or web address]
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
## 2021-10-31 ENCOUNTER — Encounter (HOSPITAL_COMMUNITY): Payer: Self-pay | Admitting: Orthopedic Surgery

## 2021-12-15 ENCOUNTER — Encounter (HOSPITAL_COMMUNITY): Payer: Self-pay

## 2021-12-15 ENCOUNTER — Ambulatory Visit (HOSPITAL_COMMUNITY)
Admission: EM | Admit: 2021-12-15 | Discharge: 2021-12-15 | Disposition: A | Discharge status: Home / Self Care | Payer: Medicaid Other | Attending: Student | Admitting: Student

## 2021-12-15 DIAGNOSIS — N4889 Other specified disorders of penis: Secondary | ICD-10-CM | POA: Diagnosis not present

## 2021-12-15 DIAGNOSIS — Z113 Encounter for screening for infections with a predominantly sexual mode of transmission: Secondary | ICD-10-CM

## 2021-12-15 NOTE — ED Provider Notes (Signed)
[facility]    CSN: [id number] Arrival date & time: 12/15/21  1053      History   Chief Complaint Chief Complaint  Patient presents with   Groin Swelling    HPI [name] is a 21 y.o. male presenting with penile pain for the last day.  History noncontributory.  He states that he is "very sexually active", and he also took herbal Viagra 2 days ago.  He had vigorous intercourse with male partner last night, and soon after developed pain at the base of his penis.  It has actually improved since then, but he wanted to get checked out.  He is feeling well otherwise, and denies any issues prior to having intercourse.  He denies recent erection greater than 4 hours. Denies hematuria, dysuria, frequency, urgency, back pain, n/v/d/abd pain, fevers/chills, abdnormal penile discharge.   HPI  Past Medical History:  Diagnosis Date   Attention deficit hyperactivity disorder (ADHD)    followed by Dr. Inda Coke   Obesity     Patient Active Problem List   Diagnosis Date Noted   Tinea corporis 10/08/2017   Inguinal lymphadenopathy 01/13/2015   Sleep disorder 05/22/2013   Obesity 04/01/2013   Attention deficit hyperactivity disorder (ADHD)     Past Surgical History:  Procedure Laterality Date   NERVE, TENDON AND ARTERY REPAIR Left 04/08/2013   Procedure: Incision and drainage of left hand wound, left carpal tunnel release, hook of hamate excision;  Surgeon: Dominica Severin, MD;  [facility];  Service: Orthopedics;  Laterality: Left;   ORIF TOE FRACTURE Left 10/29/2021   Procedure: ATTEMPTED CLOSED REDUCTION OF 2ND TOE;    OPEN REDUCTION INTERNAL FIXATION (ORIF) PHALANGEAL 2nd (TOE) FRACTURE. iRRIGATION AND DEBRIDEMENT OF LEFT GREAT TOE;  Surgeon: Myrene Galas, MD;  [facility];  Service: Orthopedics;  Laterality: Left;   TONSILLECTOMY         Home Medications    Prior to Admission medications   Medication Sig Start Date End Date Taking? Authorizing Provider   ketorolac (TORADOL) 10 MG tablet Take 1 tablet (10 mg total) by mouth every 6 (six) hours as needed for moderate pain. 10/29/21   Montez Morita, PA-C  methocarbamol (ROBAXIN) 500 MG tablet Take 1 tablet (500 mg total) by mouth every 8 (eight) hours as needed for muscle spasms. 10/29/21   Montez Morita, PA-C  ondansetron (ZOFRAN-ODT) 4 MG disintegrating tablet Take 1 tablet (4 mg total) by mouth every 8 (eight) hours as needed. 10/29/21   Montez Morita, PA-C  oxyCODONE-acetaminophen (PERCOCET) 5-325 MG tablet Take 1-2 tablets by mouth every 12 (twelve) hours as needed for severe pain. 10/29/21 10/29/22  Montez Morita, PA-C    Family History Family History  Problem Relation Age of Onset   Healthy Mother    Diabetes Other     Social History Social History   Tobacco Use   Smoking status: Passive Smoke Exposure - Never Smoker   Smokeless tobacco: Never   Tobacco comments:    Parents are smokers.  Substance Use Topics   Alcohol use: No    Alcohol/week: 0.0 standard drinks of alcohol   Drug use: Yes    Types: Marijuana    Comment: Per Hx: Pt has experimented in the past with marijuana and tobacco. Smokes marijuana twice per week.     Allergies   Patient has no known allergies.   Review of Systems Review of Systems  Genitourinary:  Positive for penile pain.  All other systems reviewed and  are negative.    Physical Exam Triage Vital Signs ED Triage Vitals [12/15/21 1150]  Enc Vitals Group     BP 112/64     Pulse Rate (!) 115     Resp 18     Temp 98 F (36.7 C)     Temp Source Oral     SpO2 98 %     Weight      Height      Head Circumference      Peak Flow      Pain Score      Pain Loc      Pain Edu?      Excl. in GC?    No data found.  Updated Vital Signs BP 112/64 (BP Location: Left Arm)   Pulse (!) 115   Temp 98 F (36.7 C) (Oral)   Resp 18   SpO2 98%   Visual Acuity Right Eye Distance:   Left Eye Distance:   Bilateral Distance:    Right Eye Near:   Left Eye  Near:    Bilateral Near:     Physical Exam Vitals reviewed.  Constitutional:      General: He is not in acute distress.    Appearance: Normal appearance. He is not ill-appearing.  HENT:     Head: Normocephalic and atraumatic.  Pulmonary:     Effort: Pulmonary effort is normal.  Genitourinary:    Comments: Chaperone: Dee, CMA  Penis is unremarkable.  There is mild tenderness to palpation at the base of the penis, without skin changes or swelling.  No rash or lesion observed. Neurological:     General: No focal deficit present.     Mental Status: He is alert and oriented to person, place, and time.  Psychiatric:        Mood and Affect: Mood normal.        Behavior: Behavior normal.        Thought Content: Thought content normal.        Judgment: Judgment normal.      UC Treatments / Results  Labs (all labs ordered are listed, but only abnormal results are displayed) Labs Reviewed  CYTOLOGY, (ORAL, ANAL, URETHRAL) ANCILLARY ONLY    EKG   Radiology No results found.  Procedures Procedures (including critical care time)  Medications Ordered in UC Medications - No data to display  Initial Impression / Assessment and Plan / UC Course  I have reviewed the triage vital signs and the nursing notes.  Pertinent labs & imaging results that were available during my care of the patient were reviewed by me and considered in my medical decision making (see chart for details).     This patient is a very pleasant 21 y.o. year old male presenting with penile pain following intercourse. No abd pain.  His penis is mildly tender to palpation at the base, without other changes or swelling.  The pain is actually improving on its own already.  I collected a cytology swab for gonorrhea, chlamydia, trichomonas.  Reassurance provided, but he understands if he has worsening penile pain, or an erection that does not go away within 4 hours, he should present to the emergency department.    Final Clinical Impressions(s) / UC Diagnoses   Final diagnoses:  Penile pain  Routine screening for STI (sexually transmitted infection)     Discharge Instructions      -Stop herbal viagra -It's a good sign that your pain is improving. If worsening pain or  an erection that persists past 4 hours, head to the ED. -We have sent testing for sexually transmitted infections. We will notify you of any positive results once they are received. If required, we will prescribe any medications you might need. Please refrain from all sexual activity until treatment is complete.  -Seek additional medical attention if you develop fevers/chills, new/worsening abdominal pain, new/worsening vaginal discomfort/discharge, etc.     ED Prescriptions   None    PDMP not reviewed this encounter.   Rhys Martini, PA-C 12/15/21 1231

## 2021-12-15 NOTE — Discharge Instructions (Addendum)
-  Stop herbal viagra -It's a good sign that your pain is improving. If worsening pain or an erection that persists past 4 hours, head to the ED. -We have sent testing for sexually transmitted infections. We will notify you of any positive results once they are received. If required, we will prescribe any medications you might need. Please refrain from all sexual activity until treatment is complete.  -Seek additional medical attention if you develop fevers/chills, new/worsening abdominal pain, new/worsening vaginal discomfort/discharge, etc.

## 2021-12-15 NOTE — ED Triage Notes (Signed)
Pt reports groin swelling that started last night after having intercourse. Pt reports no penile discharge or pain.

## 2021-12-16 LAB — CYTOLOGY, (ORAL, ANAL, URETHRAL) ANCILLARY ONLY
Chlamydia: NEGATIVE
Comment: NEGATIVE
Comment: NEGATIVE
Comment: NORMAL
Neisseria Gonorrhea: NEGATIVE
Trichomonas: NEGATIVE

## 2022-01-08 DIAGNOSIS — I10 Essential (primary) hypertension: Secondary | ICD-10-CM | POA: Diagnosis not present

## 2022-01-08 DIAGNOSIS — R000 Tachycardia, unspecified: Secondary | ICD-10-CM | POA: Diagnosis not present

## 2022-01-09 ENCOUNTER — Emergency Department (HOSPITAL_COMMUNITY)
Admission: EM | Admit: 2022-01-09 | Discharge: 2022-01-10 | Disposition: A | Discharge status: Home / Self Care | Payer: Medicaid Other | Attending: Emergency Medicine | Admitting: Emergency Medicine

## 2022-01-09 ENCOUNTER — Other Ambulatory Visit: Payer: Self-pay

## 2022-01-09 DIAGNOSIS — Z7722 Contact with and (suspected) exposure to environmental tobacco smoke (acute) (chronic): Secondary | ICD-10-CM | POA: Diagnosis not present

## 2022-01-09 DIAGNOSIS — Z79899 Other long term (current) drug therapy: Secondary | ICD-10-CM | POA: Insufficient documentation

## 2022-01-09 DIAGNOSIS — R7309 Other abnormal glucose: Secondary | ICD-10-CM | POA: Diagnosis not present

## 2022-01-09 DIAGNOSIS — F1290 Cannabis use, unspecified, uncomplicated: Secondary | ICD-10-CM | POA: Diagnosis not present

## 2022-01-09 DIAGNOSIS — F32A Depression, unspecified: Secondary | ICD-10-CM | POA: Diagnosis not present

## 2022-01-09 DIAGNOSIS — T447X2A Poisoning by beta-adrenoreceptor antagonists, intentional self-harm, initial encounter: Secondary | ICD-10-CM | POA: Diagnosis present

## 2022-01-09 DIAGNOSIS — T1491XA Suicide attempt, initial encounter: Secondary | ICD-10-CM | POA: Diagnosis present

## 2022-01-09 LAB — COMPREHENSIVE METABOLIC PANEL
ALT: 14 U/L (ref 0–44)
ALT: 14 U/L (ref 0–44)
AST: 22 U/L (ref 15–41)
AST: 28 U/L (ref 15–41)
Albumin: 4.4 g/dL (ref 3.5–5.0)
Albumin: 4.6 g/dL (ref 3.5–5.0)
Alkaline Phosphatase: 53 U/L (ref 38–126)
Alkaline Phosphatase: 59 U/L (ref 38–126)
Anion gap: 10 (ref 5–15)
Anion gap: 11 (ref 5–15)
BUN: 13 mg/dL (ref 6–20)
BUN: 16 mg/dL (ref 6–20)
CO2: 18 mmol/L — ABNORMAL LOW (ref 22–32)
CO2: 21 mmol/L — ABNORMAL LOW (ref 22–32)
Calcium: 9.1 mg/dL (ref 8.9–10.3)
Calcium: 9.2 mg/dL (ref 8.9–10.3)
Chloride: 112 mmol/L — ABNORMAL HIGH (ref 98–111)
Chloride: 112 mmol/L — ABNORMAL HIGH (ref 98–111)
Creatinine, Ser: 1.04 mg/dL (ref 0.61–1.24)
Creatinine, Ser: 1.28 mg/dL — ABNORMAL HIGH (ref 0.61–1.24)
GFR, Estimated: >60 mL/min (ref >60)
GFR, Estimated: >60 mL/min (ref >60)
Glucose, Bld: 123 mg/dL — ABNORMAL HIGH (ref 70–99)
Glucose, Bld: 151 mg/dL — ABNORMAL HIGH (ref 70–99)
Potassium: 3.6 mmol/L (ref 3.5–5.1)
Potassium: 3.8 mmol/L (ref 3.5–5.1)
Sodium: 141 mmol/L (ref 135–145)
Sodium: 143 mmol/L (ref 135–145)
Total Bilirubin: 0.5 mg/dL (ref 0.3–1.2)
Total Bilirubin: 0.6 mg/dL (ref 0.3–1.2)
Total Protein: 7 g/dL (ref 6.5–8.1)
Total Protein: 7.3 g/dL (ref 6.5–8.1)

## 2022-01-09 LAB — CBC WITH DIFFERENTIAL/PLATELET
Abs Immature Granulocytes: 0.04 10*3/uL (ref 0.00–0.07)
Basophils Absolute: 0 10*3/uL (ref 0.0–0.1)
Basophils Relative: 0 %
Eosinophils Absolute: 0.1 10*3/uL (ref 0.0–0.5)
Eosinophils Relative: 1 %
HCT: 40.2 % (ref 39.0–52.0)
Hemoglobin: 13.8 g/dL (ref 13.0–17.0)
Immature Granulocytes: 0 %
Lymphocytes Relative: 18 %
Lymphs Abs: 2.1 10*3/uL (ref 0.7–4.0)
MCH: 30.3 pg (ref 26.0–34.0)
MCHC: 34.3 g/dL (ref 30.0–36.0)
MCV: 88.2 fL (ref 80.0–100.0)
Monocytes Absolute: 0.8 10*3/uL (ref 0.1–1.0)
Monocytes Relative: 7 %
Neutro Abs: 8.4 10*3/uL — ABNORMAL HIGH (ref 1.7–7.7)
Neutrophils Relative %: 74 %
Platelets: 285 10*3/uL (ref 150–400)
RBC: 4.56 MIL/uL (ref 4.22–5.81)
RDW: 12.8 % (ref 11.5–15.5)
WBC: 11.4 10*3/uL — ABNORMAL HIGH (ref 4.0–10.5)
nRBC: 0 % (ref 0.0–0.2)

## 2022-01-09 LAB — RAPID URINE DRUG SCREEN, HOSP PERFORMED
Amphetamines: NOT DETECTED
Amphetamines: NOT DETECTED
Barbiturates: NOT DETECTED
Barbiturates: NOT DETECTED
Benzodiazepines: POSITIVE — AB
Benzodiazepines: POSITIVE — AB
Cocaine: NOT DETECTED
Cocaine: NOT DETECTED
Opiates: NOT DETECTED
Opiates: NOT DETECTED
Tetrahydrocannabinol: POSITIVE — AB
Tetrahydrocannabinol: POSITIVE — AB

## 2022-01-09 LAB — ETHANOL
Alcohol, Ethyl (B): <10 mg/dL (ref <10)
Alcohol, Ethyl (B): <10 mg/dL (ref <10)

## 2022-01-09 LAB — CBG MONITORING, ED: Glucose-Capillary: 123 mg/dL — ABNORMAL HIGH (ref 70–99)

## 2022-01-09 LAB — ACETAMINOPHEN LEVEL
Acetaminophen (Tylenol), Serum: <10 ug/mL — ABNORMAL LOW (ref 10–30)
Acetaminophen (Tylenol), Serum: <10 ug/mL — ABNORMAL LOW (ref 10–30)

## 2022-01-09 LAB — SALICYLATE LEVEL
Salicylate Lvl: <7 mg/dL — ABNORMAL LOW (ref 7.0–30.0)
Salicylate Lvl: <7 mg/dL — ABNORMAL LOW (ref 7.0–30.0)

## 2022-01-09 LAB — CK: Total CK: 249 U/L (ref 49–397)

## 2022-01-09 MED ORDER — HYDROXYZINE HCL 25 MG PO TABS
25.0000 mg | ORAL_TABLET | Freq: Three times a day (TID) | ORAL | Status: DC | PRN
  Administered 2022-01-09: 25 mg via ORAL
  Filled 2022-01-09: qty 1

## 2022-01-09 MED ORDER — SODIUM CHLORIDE 0.9 % IV SOLN: INTRAVENOUS | Status: DC

## 2022-01-09 MED ORDER — MIDAZOLAM HCL 2 MG/2ML IJ SOLN
1.0000 mg | Freq: Once | INTRAMUSCULAR | Status: AC
  Administered 2022-01-09: 1 mg via INTRAVENOUS
  Filled 2022-01-09: qty 2

## 2022-01-09 MED ORDER — MIDAZOLAM HCL 2 MG/2ML IJ SOLN
2.0000 mg | Freq: Once | INTRAMUSCULAR | Status: AC
Start: 2022-01-09 — End: 2022-01-09
  Administered 2022-01-09: 2 mg via INTRAVENOUS
  Filled 2022-01-09: qty 2

## 2022-01-09 MED ORDER — SODIUM CHLORIDE 0.9 % IV SOLN
INTRAVENOUS | Status: DC
Start: 2022-01-09 — End: 2022-01-09

## 2022-01-09 NOTE — BH Assessment (Signed)
Per Dr. Kumar in the 9am morning huddle/bed meeting, Provider to assess. No TTS CCA is needed at this time. 

## 2022-01-09 NOTE — ED Notes (Signed)
Poison control called for pt. Update. Per poison control everything that can be done has been done. No further recommendations from poison control.

## 2022-01-09 NOTE — Consult Note (Signed)
[facility] ED ASSESSMENT   Reason for Consult:  Psychiatry evaluation Referring Physician:  ER Physician Patient Identification: [name] MRN:  [id number] ED Chief Complaint: Suicide attempt [facility])  Diagnosis:  Principal Problem:   Suicide attempt [facility]) Active Problems:   Depression   ED Assessment Time Calculation: No data recorded  Subjective:   [name] is a 21 y.o. male patient admitted with hx of ADHD brought in by EMS after OD on 10 tablets of 12.5 mg Coreg (Carvedilol) after a break up with his girl friend.  Patient reported at the time that he had no reason to live after he found out that his relationship of 10 years just ended as a result of his GF cheating with another man.  On arrival to the ER patient was restless and eloped once.  He was made IVC and was on restraint.  HPI:  Patient was met this morning for initial Psychiatric evaluation.  Patient was tearful throughout our interaction.  Patient would suddenly pound on his chest asking why his GF did this to him.  Patient also today felt life is not worth living with his GF.  He requested to be discharged home to go talk to her.  Patient also want to go back to work.  When offered telephone for patient to call his GF he declined and wanted to see her face to face.  Patient reported broken hear, denied feeling suicidal but stated"  I am sad, My heart is broken"  Patient denied seeing a Psychiatrist or therapist.  He is currently not taking any Psychotropic medications.  He denied previous inpatient Psychiatric hospitalization.  Patient denied suicide ideation but the extent of anger and disappointment he feels makes him  a danger to self and the GF.  He meets criteria for inpatient hospitalization for close observation and allow her grieve the loss.  Past Psychiatric History: ADHD, No previous inpatient Psychiatric hospitalization  Risk to Self or Others: Is the patient at risk to self? No Has the patient been a risk to self  in the past 6 months? No Has the patient been a risk to self within the distant past? No Is the patient a risk to others? No Has the patient been a risk to others in the past 6 months? No Has the patient been a risk to others within the distant past? No  [place] Scale:  [place] ED from 01/09/2022 in [place] DEPT ED from 12/15/2021 in [facility] ED to Hosp-Admission (Discharged) from 10/29/2021 in [place] CATEGORY High Risk No Risk No Risk       AIMS:  , , ,  ,   ASAM:    Substance Abuse:     Past Medical History:  Past Medical History:  Diagnosis Date   Attention deficit hyperactivity disorder (ADHD)    followed by Dr. Quentin Cornwall   Obesity     Past Surgical History:  Procedure Laterality Date   NERVE, TENDON AND ARTERY REPAIR Left 04/08/2013   Procedure: Incision and drainage of left hand wound, left carpal tunnel release, hook of hamate excision;  Surgeon: Roseanne Kaufman, MD;  Location: [facility];  Service: Orthopedics;  Laterality: Left;   ORIF TOE FRACTURE Left 10/29/2021   Procedure: ATTEMPTED CLOSED REDUCTION OF 2ND TOE;    OPEN REDUCTION INTERNAL FIXATION (ORIF) PHALANGEAL 2nd (TOE) FRACTURE. iRRIGATION AND DEBRIDEMENT OF LEFT GREAT TOE;  Surgeon: Altamese Lyncourt, MD;  Location: [facility];  Service: Orthopedics;  Laterality: Left;   TONSILLECTOMY     Family History:  Family History  Problem Relation Age of Onset   Healthy Mother    Diabetes Other    Family Psychiatric  History: unknown Social History:  Social History   Substance and Sexual Activity  Alcohol Use No   Alcohol/week: 0.0 standard drinks of alcohol     Social History   Substance and Sexual Activity  Drug Use Yes   Types: Marijuana   Comment: Per Hx: Pt has experimented in the past with marijuana and tobacco. Smokes marijuana twice per week.    Social History   Socioeconomic History   Marital status: Single     Spouse name: Not on file   Number of children: Not on file   Years of education: Not on file   Highest education level: Not on file  Occupational History   Not on file  Tobacco Use   Smoking status: Passive Smoke Exposure - Never Smoker   Smokeless tobacco: Never   Tobacco comments:    Parents are smokers.  Substance and Sexual Activity   Alcohol use: No    Alcohol/week: 0.0 standard drinks of alcohol   Drug use: Yes    Types: Marijuana    Comment: Per Hx: Pt has experimented in the past with marijuana and tobacco. Smokes marijuana twice per week.   Sexual activity: Never  Other Topics Concern   Not on file  Social History Narrative   Patient is the oldest of 8 children. There are a total of 5 children in the home, with him being the oldest. 2 siblings live in [place], and 1 sibling has passed away. He lives in the home with his mom, step-dad, and 4 siblings. There is also a pet [place] pig in the home.   Social Determinants of Health   Financial Resource Strain: Not on file  Food Insecurity: Not on file  Transportation Needs: Not on file  Physical Activity: Not on file  Stress: Not on file  Social Connections: Not on file   Additional Social History:    Allergies:  No Known Allergies  Labs:  Results for orders placed or performed during the hospital encounter of 01/09/22 (from the past 48 hour(s))  Comprehensive metabolic panel     Status: Abnormal   Collection Time: 01/09/22 12:29 AM  Result Value Ref Range   Sodium 143 135 - 145 mmol/L   Potassium 3.6 3.5 - 5.1 mmol/L   Chloride 112 (H) 98 - 111 mmol/L   CO2 21 (L) 22 - 32 mmol/L   Glucose, Bld 123 (H) 70 - 99 mg/dL    Comment: Glucose reference range applies only to samples taken after fasting for at least 8 hours.   BUN 16 6 - 20 mg/dL   Creatinine, Ser 1.04 0.61 - 1.24 mg/dL   Calcium 9.2 8.9 - 10.3 mg/dL   Total Protein 7.3 6.5 - 8.1 g/dL   Albumin 4.6 3.5 - 5.0 g/dL   AST 22 15 - 41 U/L   ALT 14 0 - 44 U/L    Alkaline Phosphatase 59 38 - 126 U/L   Total Bilirubin 0.5 0.3 - 1.2 mg/dL   GFR, Estimated >60 >60 mL/min    Comment: (NOTE) Calculated using the CKD-EPI Creatinine Equation (2021)    Anion gap 10 5 - 15    Comment: Performed at [facility], [place] [address], [place], [place] [address]  Salicylate  level     Status: Abnormal   Collection Time: 01/09/22 12:29 AM  Result Value Ref Range   Salicylate Lvl <1.7 (L) 7.0 - 30.0 mg/dL    Comment: Performed at [facility], [place] [address] [place], [place] [address]  Acetaminophen level     Status: Abnormal   Collection Time: 01/09/22 12:29 AM  Result Value Ref Range   Acetaminophen (Tylenol), Serum <10 (L) 10 - 30 ug/mL    Comment: (NOTE) Therapeutic concentrations vary significantly. A range of 10-30 ug/mL  may be an effective concentration for many patients. However, some  are best treated at concentrations outside of this range. Acetaminophen concentrations >150 ug/mL at 4 hours after ingestion  and >50 ug/mL at 12 hours after ingestion are often associated with  toxic reactions.  Performed at [facility], [place] [address], [place], [place] [address]   Ethanol     Status: None   Collection Time: 01/09/22 12:29 AM  Result Value Ref Range   Alcohol, Ethyl (B) <10 <10 mg/dL    Comment: (NOTE) Lowest detectable limit for serum alcohol is 10 mg/dL.  For medical purposes only. Performed at [facility], [place] [address] [place], [place] [address]   CBC WITH DIFFERENTIAL     Status: Abnormal   Collection Time: 01/09/22 12:29 AM  Result Value Ref Range   WBC 11.4 (H) 4.0 - 10.5 K/uL   RBC 4.56 4.22 - 5.81 MIL/uL   Hemoglobin 13.8 13.0 - 17.0 g/dL   HCT 40.2 39.0 - 52.0 %   MCV 88.2 80.0 - 100.0 fL   MCH 30.3 26.0 - 34.0 pg   MCHC 34.3 30.0 - 36.0 g/dL   RDW 12.8 11.5 - 15.5 %   Platelets 285 150 - 400 K/uL   nRBC 0.0 0.0 - 0.2 %   Neutrophils Relative % 74  %   Neutro Abs 8.4 (H) 1.7 - 7.7 K/uL   Lymphocytes Relative 18 %   Lymphs Abs 2.1 0.7 - 4.0 K/uL   Monocytes Relative 7 %   Monocytes Absolute 0.8 0.1 - 1.0 K/uL   Eosinophils Relative 1 %   Eosinophils Absolute 0.1 0.0 - 0.5 K/uL   Basophils Relative 0 %   Basophils Absolute 0.0 0.0 - 0.1 K/uL   Immature Granulocytes 0 %   Abs Immature Granulocytes 0.04 0.00 - 0.07 K/uL    Comment: Performed at [facility], [place] [address], [place], [place] [address]  CK     Status: None   Collection Time: 01/09/22 12:29 AM  Result Value Ref Range   Total CK 249 49 - 397 U/L    Comment: Performed at [facility], [place] [address] [place], [place] [address]  CBG monitoring, ED     Status: Abnormal   Collection Time: 01/09/22 12:52 AM  Result Value Ref Range   Glucose-Capillary 123 (H) 70 - 99 mg/dL    Comment: Glucose reference range applies only to samples taken after fasting for at least 8 hours.  Urine rapid drug screen (hosp performed)     Status: Abnormal   Collection Time: 01/09/22  1:09 AM  Result Value Ref Range   Opiates NONE DETECTED NONE DETECTED   Cocaine NONE DETECTED NONE DETECTED   Benzodiazepines POSITIVE (A) NONE DETECTED   Amphetamines NONE DETECTED NONE DETECTED   Tetrahydrocannabinol POSITIVE (A) NONE DETECTED   Barbiturates NONE DETECTED NONE DETECTED    Comment: (NOTE) DRUG SCREEN FOR  MEDICAL PURPOSES ONLY.  IF CONFIRMATION IS NEEDED FOR ANY PURPOSE, NOTIFY LAB WITHIN 5 DAYS.  LOWEST DETECTABLE LIMITS FOR URINE DRUG SCREEN Drug Class                     Cutoff (ng/mL) Amphetamine and metabolites    1000 Barbiturate and metabolites    200 Benzodiazepine                 115 Tricyclics and metabolites     300 Opiates and metabolites        300 Cocaine and metabolites        300 THC                            50 Performed at [facility], [place] [address], [place], [place] [address]   Salicylate level      Status: Abnormal   Collection Time: 01/09/22  4:13 AM  Result Value Ref Range   Salicylate Lvl <3.5 (L) 7.0 - 30.0 mg/dL    Comment: Performed at [facility], [place] [address], [place], [place] [address]  Acetaminophen level     Status: Abnormal   Collection Time: 01/09/22  4:13 AM  Result Value Ref Range   Acetaminophen (Tylenol), Serum <10 (L) 10 - 30 ug/mL    Comment: (NOTE) Therapeutic concentrations vary significantly. A range of 10-30 ug/mL  may be an effective concentration for many patients. However, some  are best treated at concentrations outside of this range. Acetaminophen concentrations >150 ug/mL at 4 hours after ingestion  and >50 ug/mL at 12 hours after ingestion are often associated with  toxic reactions.  Performed at [facility], [place] [address] [place], [place] [address]   Ethanol     Status: None   Collection Time: 01/09/22  4:13 AM  Result Value Ref Range   Alcohol, Ethyl (B) <10 <10 mg/dL    Comment: (NOTE) Lowest detectable limit for serum alcohol is 10 mg/dL.  For medical purposes only. Performed at [facility], [place] [address] [place], [place] [address]   Comprehensive metabolic panel     Status: Abnormal   Collection Time: 01/09/22  4:13 AM  Result Value Ref Range   Sodium 141 135 - 145 mmol/L   Potassium 3.8 3.5 - 5.1 mmol/L   Chloride 112 (H) 98 - 111 mmol/L   CO2 18 (L) 22 - 32 mmol/L   Glucose, Bld 151 (H) 70 - 99 mg/dL    Comment: Glucose reference range applies only to samples taken after fasting for at least 8 hours.   BUN 13 6 - 20 mg/dL   Creatinine, Ser 1.28 (H) 0.61 - 1.24 mg/dL   Calcium 9.1 8.9 - 10.3 mg/dL   Total Protein 7.0 6.5 - 8.1 g/dL   Albumin 4.4 3.5 - 5.0 g/dL   AST 28 15 - 41 U/L   ALT 14 0 - 44 U/L   Alkaline Phosphatase 53 38 - 126 U/L   Total Bilirubin 0.6 0.3 - 1.2 mg/dL   GFR, Estimated >60 >60 mL/min    Comment: (NOTE) Calculated using the CKD-EPI  Creatinine Equation (2021)    Anion gap 11 5 - 15    Comment: Performed at [facility], [place] [address] [place],  [address]    Current Facility-Administered Medications  Medication Dose Route Frequency Provider Last Rate Last Admin  0.9 %  sodium chloride infusion   Intravenous Continuous Jeanell Sparrow, DO   Stopped at 01/09/22 1054   hydrOXYzine (ATARAX) tablet 25 mg  25 mg Oral TID PRN Delfin Gant, NP       Current Outpatient Medications  Medication Sig Dispense Refill   carvedilol (COREG) 12.5 MG tablet Take 12.5 mg by mouth 2 (two) times daily with a meal.     ibuprofen (ADVIL) 800 MG tablet Take 800 mg by mouth 3 (three) times daily as needed. (Patient not taking: Reported on 01/09/2022)     ketorolac (TORADOL) 10 MG tablet Take 1 tablet (10 mg total) by mouth every 6 (six) hours as needed for moderate pain. (Patient not taking: Reported on 01/09/2022) 20 tablet 0   methocarbamol (ROBAXIN) 500 MG tablet Take 1 tablet (500 mg total) by mouth every 8 (eight) hours as needed for muscle spasms. (Patient not taking: Reported on 01/09/2022) 30 tablet 0   ondansetron (ZOFRAN-ODT) 4 MG disintegrating tablet Take 1 tablet (4 mg total) by mouth every 8 (eight) hours as needed. (Patient not taking: Reported on 01/09/2022) 20 tablet 0   oxyCODONE-acetaminophen (PERCOCET) 5-325 MG tablet Take 1-2 tablets by mouth every 12 (twelve) hours as needed for severe pain. (Patient not taking: Reported on 01/09/2022) 28 tablet 0    Musculoskeletal: Strength & Muscle Tone: within normal limits Gait & Station: normal Patient leans: Front   Psychiatric Specialty Exam: Presentation  General Appearance: Appropriate for Environment; Neat; Disheveled  Eye Contact:Fleeting  Speech:Clear and Coherent; Normal Rate  Speech Volume:Normal  Handedness:Right   Mood and Affect  Mood:Angry; Labile  Affect:Congruent; Labile; Tearful   Thought Process  Thought  Processes:Coherent; Goal Directed; Linear  Descriptions of Associations:Intact  Orientation:Full (Time, Place and Person)  Thought Content:Logical  History of Schizophrenia/Schizoaffective disorder:No data recorded Duration of Psychotic Symptoms:No data recorded Hallucinations:Hallucinations: None  Ideas of Reference:None  Suicidal Thoughts:Suicidal Thoughts: No  Homicidal Thoughts:Homicidal Thoughts: No   Sensorium  Memory:Immediate Good; Recent Good; Remote Good  Judgment:Poor  Insight:Poor   Executive Functions  Concentration:Fair  Attention Span:Fair  Recall:Good  Fund of Knowledge:Good  Language:Good   Psychomotor Activity  Psychomotor Activity:Psychomotor Activity: Normal   Assets  Assets:Communication Skills; Desire for Improvement; Financial Resources/Insurance; Housing; Physical Health    Sleep  Sleep:Sleep: Fair   Physical Exam: Physical Exam Vitals and nursing note reviewed.  Constitutional:      Appearance: Normal appearance.  HENT:     Head: Normocephalic and atraumatic.     Nose: Nose normal.  Cardiovascular:     Rate and Rhythm: Normal rate.  Pulmonary:     Effort: Pulmonary effort is normal.  Musculoskeletal:        General: Normal range of motion.     Cervical back: Normal range of motion.  Skin:    General: Skin is warm and dry.  Neurological:     Mental Status: He is alert and oriented to person, place, and time.    Review of Systems  Constitutional: Negative.   HENT: Negative.    Eyes: Negative.   Respiratory: Negative.    Cardiovascular: Negative.   Gastrointestinal: Negative.   Genitourinary: Negative.   Musculoskeletal: Negative.   Skin: Negative.   Neurological: Negative.   Endo/Heme/Allergies: Negative.   Psychiatric/Behavioral:  Positive for depression and suicidal ideas. The patient is nervous/anxious.    Blood pressure 118/60, pulse 69, temperature 98.7 F (37.1 C), temperature source Oral, resp. rate  (!) 22, height _0  (1.626  m), weight 63.5 kg, SpO2 100 %. Body mass index is 24.03 kg/m.  Medical Decision Making: Patient meets criteria for inpatient hospitalization for safety and stabilization.  We will seek bed placement at any facility that has available beds.  Although patient denies suicide ideation he is still hurting the loss of his relationship.  Coming into the hospital will help him process the loss and learn coping skills.  We will offer Hydroxyzine as needed for anxiety.  Problem 1: Suicide attempt  Problem 2: Depression, single episode without Psychotic features.  Disposition:  Admit, seek bed placement.  Delfin Gant, NP-PMHNP-BC 01/09/2022 12:24 PM

## 2022-01-09 NOTE — ED Notes (Signed)
Pt pulled out IV and come into hallway saying he was leaving. Pt made aware that he was IVC'd and he could not leave at this time. Pt eloped out of the ED. Security and [employer] made aware that pt was IVC'd. Charge nurse updated.

## 2022-01-09 NOTE — ED Notes (Signed)
Pt vitals will be taken when pt wakes up

## 2022-01-09 NOTE — ED Notes (Signed)
[employer] still searching for patient.

## 2022-01-09 NOTE — ED Notes (Signed)
Pt. States, "I don't understand why I'm being held against my own will. This isn't fucking fair. I want to go home."

## 2022-01-09 NOTE — ED Triage Notes (Signed)
Pt BIB EMS for a suicide attempt. Per EMS, pt took about ten 12.5mg  carvedilol. Per pt, his girlfriend cheated on him and he does not want to live anymore. [employer] at bedside with pt. Pt very emotional and tearful during triage.

## 2022-01-09 NOTE — ED Notes (Signed)
Patient's grandmother, Rosalita Chessman, who visited earlier was updated about pt elopement. She will call if patient comes to her residence.

## 2022-01-09 NOTE — ED Provider Notes (Addendum)
[facility]-EMERGENCY DEPT Provider Note   CSN: [id number] Arrival date & time: 01/09/22  0008     History  Chief Complaint  Patient presents with   Suicide Attempt    [name] is a 21 y.o. male.  Patient as above with significant medical history as below, including ADHD who presents to the ED with complaint of overdose.  Patient reports this evening he discovered that his significant other was apparently cheating on him.  He became very upset, decided that he wanted kill himself and subsequently took initially unknown amount of carvedilol 12.5 mg.  EMS and PD was called by girlfriend.  Patient was combative on arrival.  Continues to verbalize thoughts of wanting to kill himself.  Reports that he "I have nothing to live for," and that "I want to die."  Patient reports daily THC use.  No alcohol use.  No other coingestion.  Normal state of health prior to this today.   Per PD estimated that pt took around 10 tablets. There were multiple tablets on the floor and he had spit some out in the sink as well.      Past Medical History:  Diagnosis Date   Attention deficit hyperactivity disorder (ADHD)    followed by Dr. Inda Coke   Obesity     Past Surgical History:  Procedure Laterality Date   NERVE, TENDON AND ARTERY REPAIR Left 04/08/2013   Procedure: Incision and drainage of left hand wound, left carpal tunnel release, hook of hamate excision;  Surgeon: Dominica Severin, MD;  Location: [facility] OR;  Service: Orthopedics;  Laterality: Left;   ORIF TOE FRACTURE Left 10/29/2021   Procedure: ATTEMPTED CLOSED REDUCTION OF 2ND TOE;    OPEN REDUCTION INTERNAL FIXATION (ORIF) PHALANGEAL 2nd (TOE) FRACTURE. iRRIGATION AND DEBRIDEMENT OF LEFT GREAT TOE;  Surgeon: Myrene Galas, MD;  Location: [facility] OR;  Service: Orthopedics;  Laterality: Left;   TONSILLECTOMY       The history is provided by the patient and the police. No language interpreter was used.       Home  Medications Prior to Admission medications   Medication Sig Start Date End Date Taking? Authorizing Provider  carvedilol (COREG) 12.5 MG tablet Take 12.5 mg by mouth 2 (two) times daily with a meal.   Yes [provider]  ibuprofen (ADVIL) 800 MG tablet Take 800 mg by mouth 3 (three) times daily as needed. Patient not taking: Reported on 01/09/2022 01/06/22   [provider]  ketorolac (TORADOL) 10 MG tablet Take 1 tablet (10 mg total) by mouth every 6 (six) hours as needed for moderate pain. Patient not taking: Reported on 01/09/2022 10/29/21   Montez Morita, PA-C  methocarbamol (ROBAXIN) 500 MG tablet Take 1 tablet (500 mg total) by mouth every 8 (eight) hours as needed for muscle spasms. Patient not taking: Reported on 01/09/2022 10/29/21   Montez Morita, PA-C  ondansetron (ZOFRAN-ODT) 4 MG disintegrating tablet Take 1 tablet (4 mg total) by mouth every 8 (eight) hours as needed. Patient not taking: Reported on 01/09/2022 10/29/21   Montez Morita, PA-C  oxyCODONE-acetaminophen (PERCOCET) 5-325 MG tablet Take 1-2 tablets by mouth every 12 (twelve) hours as needed for severe pain. Patient not taking: Reported on 01/09/2022 10/29/21 10/29/22  Montez Morita, PA-C      Allergies    Patient has no known allergies.    Review of Systems   Review of Systems  Constitutional:  Negative for chills and fever.  HENT:  Negative for  facial swelling and trouble swallowing.   Eyes:  Negative for photophobia and visual disturbance.  Respiratory:  Negative for cough and shortness of breath.   Cardiovascular:  Negative for chest pain and palpitations.  Gastrointestinal:  Positive for nausea. Negative for abdominal pain and vomiting.  Endocrine: Negative for polydipsia and polyuria.  Genitourinary:  Negative for difficulty urinating and hematuria.  Musculoskeletal:  Negative for gait problem and joint swelling.  Skin:  Negative for pallor and rash.  Neurological:  Negative for syncope and headaches.   Psychiatric/Behavioral:  Positive for agitation and suicidal ideas. Negative for confusion. The patient is nervous/anxious and is hyperactive.     Physical Exam Updated Vital Signs BP 118/60   Pulse 69   Temp 98.7 F (37.1 C) (Oral)   Resp (!) 22   Ht 5\' 4"  (1.626 m)   Wt 63.5 kg   SpO2 100%   BMI 24.03 kg/m  Physical Exam Vitals and nursing note reviewed.  Constitutional:      General: He is not in acute distress.    Appearance: Normal appearance. He is well-developed.  HENT:     Head: Normocephalic and atraumatic.     Right Ear: External ear normal.     Left Ear: External ear normal.     Mouth/Throat:     Mouth: Mucous membranes are moist.  Eyes:     General: No scleral icterus.    Pupils: Pupils are equal, round, and reactive to light.  Cardiovascular:     Rate and Rhythm: Normal rate and regular rhythm.     Pulses: Normal pulses.     Heart sounds: Normal heart sounds.  Pulmonary:     Effort: Pulmonary effort is normal. No respiratory distress.     Breath sounds: Normal breath sounds.  Abdominal:     General: Abdomen is flat.     Palpations: Abdomen is soft.     Tenderness: There is no abdominal tenderness.  Musculoskeletal:        General: Normal range of motion.     Cervical back: Normal range of motion.     Right lower leg: No edema.     Left lower leg: No edema.  Skin:    General: Skin is warm and dry.     Capillary Refill: Capillary refill takes less than 2 seconds.  Neurological:     Mental Status: He is alert and oriented to person, place, and time.     GCS: GCS eye subscore is 4. GCS verbal subscore is 5. GCS motor subscore is 6.  Psychiatric:        Mood and Affect: Mood is anxious. Affect is tearful.        Behavior: Behavior is uncooperative, agitated and hyperactive. Behavior is not aggressive.        Thought Content: Thought content includes suicidal ideation. Thought content does not include homicidal ideation. Thought content includes suicidal  plan. Thought content does not include homicidal plan.     ED Results / Procedures / Treatments   Labs (all labs ordered are listed, but only abnormal results are displayed) Labs Reviewed  COMPREHENSIVE METABOLIC PANEL - Abnormal; Notable for the following components:      Result Value   Chloride 112 (*)    CO2 21 (*)    Glucose, Bld 123 (*)    All other components within normal limits  SALICYLATE LEVEL - Abnormal; Notable for the following components:   Salicylate Lvl <7.0 (*)    All other  components within normal limits  ACETAMINOPHEN LEVEL - Abnormal; Notable for the following components:   Acetaminophen (Tylenol), Serum <10 (*)    All other components within normal limits  RAPID URINE DRUG SCREEN, HOSP PERFORMED - Abnormal; Notable for the following components:   Benzodiazepines POSITIVE (*)    Tetrahydrocannabinol POSITIVE (*)    All other components within normal limits  CBC WITH DIFFERENTIAL/PLATELET - Abnormal; Notable for the following components:   WBC 11.4 (*)    Neutro Abs 8.4 (*)    All other components within normal limits  SALICYLATE LEVEL - Abnormal; Notable for the following components:   Salicylate Lvl <7.0 (*)    All other components within normal limits  ACETAMINOPHEN LEVEL - Abnormal; Notable for the following components:   Acetaminophen (Tylenol), Serum <10 (*)    All other components within normal limits  COMPREHENSIVE METABOLIC PANEL - Abnormal; Notable for the following components:   Chloride 112 (*)    CO2 18 (*)    Glucose, Bld 151 (*)    Creatinine, Ser 1.28 (*)    All other components within normal limits  CBG MONITORING, ED - Abnormal; Notable for the following components:   Glucose-Capillary 123 (*)    All other components within normal limits  ETHANOL  CK  ETHANOL  RAPID URINE DRUG SCREEN, HOSP PERFORMED    EKG EKG Interpretation  Date/Time:  Monday January 09 2022 00:17:17 EDT Ventricular Rate:  102 PR Interval:  119 QRS  Duration: 81 QT Interval:  324 QTC Calculation: 422 R Axis:   82 Text Interpretation: Sinus tachycardia Atrial premature complexes similar to prior tracing Although rate has increased no stemi Confirmed by Tanda Rockers ([id number]) on 01/09/2022 12:36:35 AM  Radiology No results found.  Procedures .Critical Care  Performed by: Sloan Leiter, DO Authorized by: Sloan Leiter, DO   Critical care provider statement:    Critical care time (minutes):  30   Critical care time was exclusive of:  Separately billable procedures and treating other patients   Critical care was necessary to treat or prevent imminent or life-threatening deterioration of the following conditions:  Toxidrome   Critical care was time spent personally by me on the following activities:  Development of treatment plan with patient or surrogate, discussions with consultants, evaluation of patient's response to treatment, examination of patient, ordering and review of laboratory studies, ordering and review of radiographic studies, ordering and performing treatments and interventions, pulse oximetry, re-evaluation of patient's condition, review of old charts and obtaining history from patient or surrogate     Medications Ordered in ED Medications  0.9 %  sodium chloride infusion ( Intravenous Rate/Dose Change 01/09/22 0123)  0.9 %  sodium chloride infusion ( Intravenous New Bag/Given 01/09/22 0413)  midazolam (VERSED) injection 2 mg (2 mg Intravenous Given 01/09/22 0022)  midazolam (VERSED) injection 1 mg (1 mg Intravenous Given 01/09/22 0414)    ED Course/ Medical Decision Making/ A&P Clinical Course as of 01/09/22 0747  Mon Jan 09, 2022  0344 Pt eloped from the ED, security did pursue the patient, he left with IV in place per nursing. Charge notified [SG]  0401 Pt was picked up by [employer] a few streets over from the hospital. Pt reports he was trying to get back home. Denies any injuries or ingestion since leaving the hospital. Does  have abrasion to his 5th toe right foot.  [SG]    Clinical Course User Index [SG] Sloan Leiter, DO  Medical Decision Making Amount and/or Complexity of Data Reviewed Labs: ordered.  Risk Prescription drug management.   This patient presents to the ED with chief complaint(s) of overdose, suicidal with pertinent past medical history of ADHD which further complicates the presenting complaint. The complaint involves an extensive differential diagnosis and also carries with it a high risk of complications and morbidity.    The differential diagnosis includes but not limited to intentional overdose, accidental overdose, coingestion, intoxication, metabolic disturbance, electrolyte disturbance, infectious, illicit drug use, other acute etiologies were considered. Serious etiologies were considered.   The initial plan is to screening labs, cardiac monitor. D/w poison control >> Patient was acutely agitated, hyperverbal, being to leave the emergency department.  He was given Versed with significant treatment to his mental status.  Patient is not amenable to activated charcoal.   Additional history obtained: Additional history obtained from  PD Records reviewed  prior ed visits, prior labs/imaging  home meds  Independent labs interpretation:  The following labs were independently interpreted: UDS positive for benzos and THC.  He was given Versed on arrival secondary to agitation.  Creatinine mildly worsened from baseline earlier this evening.  Continue IV fluids.  Salicylate and acetaminophen levels are unremarkable.  Ethanol unremarkable.  Independent visualization of imaging: Cardiac monitoring was reviewed and interpreted by myself which shows NSR  Treatment and Reassessment: Given versed IVF  Consultation: - Consulted or discussed management/test interpretation w/ external professional: poison control >> recommends 6 hour observation, charcoal if able to  tolerate, glucagon if needed, monitor hemodynamics  >>> Patient reassessed at 6-hour observation mark.  Is hemodynamically stable.  Remains suicidal.  Medically cleared.  Pending TTS evaluation Consideration for admission or further workup: Admission was considered   Patient at one-point eloped in the emergency department, he was brought back to the ED by [employer].  He was placed in restraints upon return to the ED and given another dose of Versed. Labs were repeated after his return and were stable, Cr mildly elevated from prior; he has been continued on IVF. Restraints have since been removed.  Patient is more cooperative at this time.  Remains under IVC.  He is medically clear.  Pt medically cleared at this time pending TTS evaluation. He remains suicidal.   Social Determinants of health: Social History   Tobacco Use   Smoking status: Passive Smoke Exposure - Never Smoker   Smokeless tobacco: Never   Tobacco comments:    Parents are smokers.  Substance Use Topics   Alcohol use: No    Alcohol/week: 0.0 standard drinks of alcohol   Drug use: Yes    Types: Marijuana    Comment: Per Hx: Pt has experimented in the past with marijuana and tobacco. Smokes marijuana twice per week.            Final Clinical Impression(s) / ED Diagnoses Final diagnoses:  Suicide attempt [facility])  Marijuana use    Rx / DC Orders ED Discharge Orders     None         Sloan Leiter, DO 01/09/22 0746    Sloan Leiter, DO 01/09/22 [phone]

## 2022-01-09 NOTE — ED Notes (Signed)
Pt grandmother called and updated on pt condition

## 2022-01-09 NOTE — BH Assessment (Signed)
[facility] Assessment Progress Note   Per Dahlia Byes, NP, this pt requires psychiatric hospitalization at this time.  Pt presents under IVC initiated by EDP Tanda Rockers, DO.  [facility] will not be able to accommodate this pt today.  At the direction of Nelly Rout, MD this writer has sought placement for pt at facilities outside of the [facility] system.  The following facilities have been contacted to seek placement for this pt, with results as noted:  Beds available, information sent, decision pending: [facility] [place] [facility]  At capacity: [facility] (no appropriate male beds)   Doylene Canning, [place] Behavioral Health Coordinator [phone]

## 2022-01-09 NOTE — ED Notes (Signed)
Poison Control Recommendations:  Fluids & supportive care for BP/HR OBS 6 hours Charcoal if tolerated- would not recommend NG tube

## 2022-01-10 DIAGNOSIS — T1491XA Suicide attempt, initial encounter: Secondary | ICD-10-CM

## 2022-01-10 NOTE — ED Provider Notes (Signed)
Patient has been evaluated by psychiatry team now with improved sobriety, was felt to be psychiatrically stable for discharge home, with close outpatient follow-up advised.  Counseling resources provided.   Terald Sleeper, MD 01/10/22 1051

## 2022-01-10 NOTE — Progress Notes (Signed)
Inpatient Behavioral Health Placement  Pt meets inpatient criteria per Dahlia Byes, NP. There are no available beds at [facility] per [facility] Fransico Michael, RN.  Referral was sent to the following facilities;    Destination Service Provider Address Phone [facility]  [address] [place], [place] [address] [phone]  [facility]  [address] [place] [address] [phone]  [facility]  [address] [place] [address] [phone]  [facility]  [address] [place] [address] [phone]  [facility]  [address] [place] [address] [phone]  [facility]  [address] [place], [place] [address] [phone]  [facility]  [address], [place] [address] [phone]  [facility]  [address], [place] [address] [phone]  [facility]  [address], [place] [address] [phone]  [facility]  [address] [place] [place] [address] [phone]   Situation ongoing,  CSW will follow up.   Maryjean Ka, MSW, LCSWA 01/10/2022  @ 12:43 AM

## 2022-01-10 NOTE — Discharge Summary (Signed)
[facility] Psych ED Discharge  01/10/2022 11:06 AM [name]  MRN:  [id number]  Principal Problem: Suicide attempt [facility]) Discharge Diagnoses: Principal Problem:   Suicide attempt [facility]) Active Problems:   Depression  Clinical Impression:  Final diagnoses:  Suicide attempt (HCC)  Marijuana use   Subjective: Patient was seen this morning calm, cooperative and was smiling at provider.  He is no longer tearful or angry.  Patient received visitors yesterday evening-grandmother and aunt visited him.  He also reported that he spoke with his now ex GF and their discussion went well.  Patient reported that they agreed to take a break from each and if they still are interested they will get back together again.  Patient reported that he has had time to rethink the consequences of what he did over the weekend and vehemently stated he is not going to attempt suicide again for any reason.  We discussed engaging in therapy and he is open to that because he has been in therapy before.  He denied SI/HI/AVH.  Provider attempted calling his mother but phone was not active, grandmother did not answer her phone either.  Aunt, Sandrea Hughs answered her phone and stated that they visited patient last evening in the ER and patient is ready to come home.  She reported that they spoke with patient and that he is ready to come home.  Patient is alert and oriented x5, he is employed and plans to go back to work soon.  Patient and provider discussed safety plan-call 911/suicide line for suicide ideation or go to the nearest ER. Engage in therapy for the loss of a relationship.  Patient verbalizes understanding.  Patient is Psychiatrically cleared.  ED Assessment Time Calculation: No data recorded  Past Psychiatric History: see initial psychiatric evaluation note  Past Medical History:  Past Medical History:  Diagnosis Date   Attention deficit hyperactivity disorder (ADHD)    followed by Dr. Inda Coke   Obesity     Past  Surgical History:  Procedure Laterality Date   NERVE, TENDON AND ARTERY REPAIR Left 04/08/2013   Procedure: Incision and drainage of left hand wound, left carpal tunnel release, hook of hamate excision;  Surgeon: Dominica Severin, MD;  Location: [facility] OR;  Service: Orthopedics;  Laterality: Left;   ORIF TOE FRACTURE Left 10/29/2021   Procedure: ATTEMPTED CLOSED REDUCTION OF 2ND TOE;    OPEN REDUCTION INTERNAL FIXATION (ORIF) PHALANGEAL 2nd (TOE) FRACTURE. iRRIGATION AND DEBRIDEMENT OF LEFT GREAT TOE;  Surgeon: Myrene Galas, MD;  Location: [facility] OR;  Service: Orthopedics;  Laterality: Left;   TONSILLECTOMY     Family History:  Family History  Problem Relation Age of Onset   Healthy Mother    Diabetes Other    Family Psychiatric  History: see initial psychiatric evaluation note Social History:  Social History   Substance and Sexual Activity  Alcohol Use No   Alcohol/week: 0.0 standard drinks of alcohol     Social History   Substance and Sexual Activity  Drug Use Yes   Types: Marijuana   Comment: Per Hx: Pt has experimented in the past with marijuana and tobacco. Smokes marijuana twice per week.    Social History   Socioeconomic History   Marital status: Single    Spouse name: Not on file   Number of children: Not on file   Years of education: Not on file   Highest education level: Not on file  Occupational History   Not on file  Tobacco Use  Smoking status: Passive Smoke Exposure - Never Smoker   Smokeless tobacco: Never   Tobacco comments:    Parents are smokers.  Substance and Sexual Activity   Alcohol use: No    Alcohol/week: 0.0 standard drinks of alcohol   Drug use: Yes    Types: Marijuana    Comment: Per Hx: Pt has experimented in the past with marijuana and tobacco. Smokes marijuana twice per week.   Sexual activity: Never  Other Topics Concern   Not on file  Social History Narrative   Patient is the oldest of 8 children. There are a total of 5 children in the  home, with him being the oldest. 2 siblings live in [place], and 1 sibling has passed away. He lives in the home with his mom, step-dad, and 4 siblings. There is also a pet Israel pig in the home.   Social Determinants of Health   Financial Resource Strain: Not on file  Food Insecurity: Not on file  Transportation Needs: Not on file  Physical Activity: Not on file  Stress: Not on file  Social Connections: Not on file    Tobacco Cessation:  N/A, patient does not currently use tobacco products  Current Medications: Current Facility-Administered Medications  Medication Dose Route Frequency Provider Last Rate Last Admin   hydrOXYzine (ATARAX) tablet 25 mg  25 mg Oral TID PRN Earney Navy, NP   25 mg at 01/09/22 2342   Current Outpatient Medications  Medication Sig Dispense Refill   carvedilol (COREG) 12.5 MG tablet Take 12.5 mg by mouth 2 (two) times daily with a meal.     ibuprofen (ADVIL) 800 MG tablet Take 800 mg by mouth 3 (three) times daily as needed. (Patient not taking: Reported on 01/09/2022)     ketorolac (TORADOL) 10 MG tablet Take 1 tablet (10 mg total) by mouth every 6 (six) hours as needed for moderate pain. (Patient not taking: Reported on 01/09/2022) 20 tablet 0   methocarbamol (ROBAXIN) 500 MG tablet Take 1 tablet (500 mg total) by mouth every 8 (eight) hours as needed for muscle spasms. (Patient not taking: Reported on 01/09/2022) 30 tablet 0   ondansetron (ZOFRAN-ODT) 4 MG disintegrating tablet Take 1 tablet (4 mg total) by mouth every 8 (eight) hours as needed. (Patient not taking: Reported on 01/09/2022) 20 tablet 0   oxyCODONE-acetaminophen (PERCOCET) 5-325 MG tablet Take 1-2 tablets by mouth every 12 (twelve) hours as needed for severe pain. (Patient not taking: Reported on 01/09/2022) 28 tablet 0   PTA Medications: (Not in a hospital admission)   [place] Scale:  Flowsheet Row ED from 01/09/2022 in [facility] from 12/15/2021 in  [facility] ED to Hosp-Admission (Discharged) from 10/29/2021 in [place] PERIOPERATIVE AREA  C-SSRS RISK CATEGORY High Risk No Risk No Risk       Musculoskeletal: Strength & Muscle Tone: within normal limits Gait & Station: normal Patient leans: Front  Psychiatric Specialty Exam: Presentation  General Appearance: Casual; Fairly Groomed  Eye Contact:Good  Speech:Clear and Coherent; Normal Rate  Speech Volume:Normal  Handedness:Right   Mood and Affect  Mood:Euthymic  Affect:Congruent   Thought Process  Thought Processes:Coherent; Goal Directed; Linear  Descriptions of Associations:Intact  Orientation:Full (Time, Place and Person)  Thought Content:Logical  History of Schizophrenia/Schizoaffective disorder:No data recorded Duration of Psychotic Symptoms:No data recorded Hallucinations:Hallucinations: None  Ideas of Reference:None  Suicidal Thoughts:Suicidal Thoughts: No  Homicidal Thoughts:Homicidal Thoughts: No   Sensorium  Memory:Immediate Good;  Recent Good; Remote Good  Judgment:Good  Insight:Good   Executive Functions  Concentration:Good  Attention Span:Good  Recall:Good  Fund of Knowledge:Good  Language:Good   Psychomotor Activity  Psychomotor Activity:Psychomotor Activity: Normal   Assets  Assets:Communication Skills; Housing; Physical Health   Sleep  Sleep:Sleep: Good    Physical Exam: Physical Exam Vitals and nursing note reviewed.  Constitutional:      Appearance: Normal appearance.  HENT:     Head: Normocephalic.  Cardiovascular:     Rate and Rhythm: Normal rate.  Pulmonary:     Effort: Pulmonary effort is normal.  Musculoskeletal:        General: Normal range of motion.     Cervical back: Normal range of motion.  Skin:    General: Skin is warm and dry.  Neurological:     Mental Status: He is alert and oriented to person, place, and time.    Review of Systems  Constitutional:  Negative.   HENT: Negative.    Eyes: Negative.   Respiratory: Negative.    Cardiovascular: Negative.   Gastrointestinal: Negative.   Genitourinary: Negative.   Skin: Negative.   Neurological: Negative.   Endo/Heme/Allergies: Negative.   Psychiatric/Behavioral: Negative.     Blood pressure 124/76, pulse 71, temperature 99.4 F (37.4 C), temperature source Oral, resp. rate 18, height 5\' 4"  (1.626 m), weight 63.5 kg, SpO2 100 %. Body mass index is 24.03 kg/m.   Demographic Factors:  Male and Adolescent or young adult  Loss Factors: Loss of significant relationship  Historical Factors: Impulsivity  Risk Reduction Factors:   Religious beliefs about death, Employed, and Living with another person, especially a relative  Continued Clinical Symptoms:  Depression:   Insomnia  Cognitive Features That Contribute To Risk:  None    Suicide Risk:  Minimal: No identifiable suicidal ideation.  Patients presenting with no risk factors but with morbid ruminations; may be classified as minimal risk based on the severity of the depressive symptoms    Plan Of Care/Follow-up recommendations:  Activity:  as tolerated  Diet:  Regular  Medical Decision Making: This morning patient denied SI/HI/AVH.  He reported good/normal discussion with his ex gf and that they agreed to take a break from each other.  Patient has strong family support-Grandmother, Aunt and his mother.  He is employed and plans to go back to work soon.  He also agrees to engage in therapy to learn coping skills  Problem 1: Suicide attempt  Problem 2: Depression, single episode without Psychotic features  Disposition: Psychiatrically cleared.  , NP-PMHNP-BC 01/10/2022, 11:06 AM

## 2022-01-10 NOTE — BH Assessment (Signed)
[facility] Assessment Progress Note   Per Dahlia Byes, NP, this pt does not require psychiatric hospitalization at this time.  Pt presents under IVC initiated by EDP Tanda Rockers, DO which has been rescinded by Nelly Rout, MD.  Pt is psychiatrically cleared.  Discharge instructions include referral information for [facility].  EDP Alvester Chou, MD and pt's nurse, Danielle Dess, have been notified.  Doylene Canning, MA Triage Specialist [phone]

## 2022-01-10 NOTE — Discharge Instructions (Addendum)
For your behavioral health needs you are advised to follow up with [facility] at your earliest opportunity:      [facility]      [address] SECOND FLOOR      [place],  [address]      [phone]      They offer psychiatry/medication management and therapy.  New patients are seen in their walk-in clinic.  Walk-in hours are Monday, Wednesday, Thursday and Friday from 8:00 am - 11:00 am for psychiatry, and Monday and Wednesday from 8:00 am - 11:00 am for therapy.  Walk-in patients are seen on a first come, first served basis, so try to arrive as early as possible for the best chance of being seen the same day.  BE SURE TO TAKE THE ELEVATOR TO THE SECOND FLOOR.  Please note that to be eligible for services you must bring an ID or a piece of mail with your name and a [place] address.  

## 2022-01-10 NOTE — ED Notes (Signed)
Pt. Verbilized understanding and comprhension of AVS. Pt stated he had no concerns or questions regarding his AVS and is ready for discharge.

## 2022-11-19 DIAGNOSIS — Z20828 Contact with and (suspected) exposure to other viral communicable diseases: Secondary | ICD-10-CM | POA: Diagnosis not present

## 2022-12-06 ENCOUNTER — Encounter (HOSPITAL_COMMUNITY): Payer: Self-pay | Admitting: Emergency Medicine

## 2022-12-06 ENCOUNTER — Emergency Department (HOSPITAL_COMMUNITY)
Admission: EM | Admit: 2022-12-06 | Discharge: 2022-12-06 | Disposition: A | Discharge status: Home / Self Care | Payer: Medicaid Other | Attending: Emergency Medicine | Admitting: Emergency Medicine

## 2022-12-06 ENCOUNTER — Emergency Department (HOSPITAL_COMMUNITY): Payer: Medicaid Other

## 2022-12-06 DIAGNOSIS — S70211A Abrasion, right hip, initial encounter: Secondary | ICD-10-CM | POA: Insufficient documentation

## 2022-12-06 DIAGNOSIS — T07XXXA Unspecified multiple injuries, initial encounter: Secondary | ICD-10-CM

## 2022-12-06 DIAGNOSIS — R0602 Shortness of breath: Secondary | ICD-10-CM | POA: Diagnosis not present

## 2022-12-06 DIAGNOSIS — M79642 Pain in left hand: Secondary | ICD-10-CM | POA: Diagnosis not present

## 2022-12-06 DIAGNOSIS — Z043 Encounter for examination and observation following other accident: Secondary | ICD-10-CM | POA: Diagnosis not present

## 2022-12-06 DIAGNOSIS — S50311A Abrasion of right elbow, initial encounter: Secondary | ICD-10-CM | POA: Diagnosis not present

## 2022-12-06 DIAGNOSIS — Z23 Encounter for immunization: Secondary | ICD-10-CM | POA: Diagnosis not present

## 2022-12-06 DIAGNOSIS — S42031A Displaced fracture of lateral end of right clavicle, initial encounter for closed fracture: Secondary | ICD-10-CM | POA: Diagnosis not present

## 2022-12-06 DIAGNOSIS — S42021A Displaced fracture of shaft of right clavicle, initial encounter for closed fracture: Secondary | ICD-10-CM | POA: Insufficient documentation

## 2022-12-06 DIAGNOSIS — S4991XA Unspecified injury of right shoulder and upper arm, initial encounter: Secondary | ICD-10-CM | POA: Diagnosis present

## 2022-12-06 DIAGNOSIS — S30811A Abrasion of abdominal wall, initial encounter: Secondary | ICD-10-CM | POA: Insufficient documentation

## 2022-12-06 DIAGNOSIS — M25461 Effusion, right knee: Secondary | ICD-10-CM | POA: Diagnosis not present

## 2022-12-06 DIAGNOSIS — S60511A Abrasion of right hand, initial encounter: Secondary | ICD-10-CM | POA: Insufficient documentation

## 2022-12-06 DIAGNOSIS — Y9351 Activity, roller skating (inline) and skateboarding: Secondary | ICD-10-CM | POA: Diagnosis not present

## 2022-12-06 MED ORDER — OXYCODONE-ACETAMINOPHEN 5-325 MG PO TABS: 1.0000 | ORAL_TABLET | Freq: Four times a day (QID) | ORAL | 0 refills | Status: DC | PRN

## 2022-12-06 MED ORDER — TETANUS-DIPHTH-ACELL PERTUSSIS 5-2.5-18.5 LF-MCG/0.5 IM SUSY
0.5000 mL | PREFILLED_SYRINGE | Freq: Once | INTRAMUSCULAR | Status: AC
  Administered 2022-12-06: 0.5 mL via INTRAMUSCULAR
  Filled 2022-12-06: qty 0.5

## 2022-12-06 MED ORDER — OXYCODONE-ACETAMINOPHEN 5-325 MG PO TABS
2.0000 | ORAL_TABLET | Freq: Once | ORAL | Status: AC
  Administered 2022-12-06: 2 via ORAL
  Filled 2022-12-06: qty 2

## 2022-12-06 MED ORDER — BACITRACIN ZINC 500 UNIT/GM EX OINT
TOPICAL_OINTMENT | Freq: Two times a day (BID) | CUTANEOUS | Status: DC
  Administered 2022-12-06: 1 via TOPICAL
  Filled 2022-12-06: qty 0.9

## 2022-12-06 NOTE — Discharge Instructions (Addendum)
I've attached Dr. Warren Danes contact info per your request.  Please wear the sling until seen by the orthopedic doctor.  Take pain medication as prescribed.  Use ice several times per day.  Keep your wounds clean and dry.

## 2022-12-06 NOTE — H&P (Signed)
PREOPERATIVE H&P  Chief Complaint: RIGHT CLAVICLE FRACTURE  HPI: [name] is a 22 y.o. male who presents with a diagnosis of RIGHT CLAVICLE FRACTURE. Symptoms are rated as moderate to severe, and have been worsening.  This is significantly impairing activities of daily living.  He has elected for surgical management.   Past Medical History:  Diagnosis Date   Attention deficit hyperactivity disorder (ADHD)    followed by Dr. Inda Coke   Obesity    Past Surgical History:  Procedure Laterality Date   NERVE, TENDON AND ARTERY REPAIR Left 04/08/2013   Procedure: Incision and drainage of left hand wound, left carpal tunnel release, hook of hamate excision;  Surgeon: Dominica Severin, MD;  Location: [facility] OR;  Service: Orthopedics;  Laterality: Left;   ORIF TOE FRACTURE Left 10/29/2021   Procedure: ATTEMPTED CLOSED REDUCTION OF 2ND TOE;    OPEN REDUCTION INTERNAL FIXATION (ORIF) PHALANGEAL 2nd (TOE) FRACTURE. iRRIGATION AND DEBRIDEMENT OF LEFT GREAT TOE;  Surgeon: Myrene Galas, MD;  Location: [facility] OR;  Service: Orthopedics;  Laterality: Left;   TONSILLECTOMY     Social History   Socioeconomic History   Marital status: Single    Spouse name: Not on file   Number of children: Not on file   Years of education: Not on file   Highest education level: Not on file  Occupational History   Not on file  Tobacco Use   Smoking status: Passive Smoke Exposure - Never Smoker   Smokeless tobacco: Never   Tobacco comments:    Parents are smokers.  Substance and Sexual Activity   Alcohol use: No    Alcohol/week: 0.0 standard drinks of alcohol   Drug use: Yes    Types: Marijuana    Comment: Per Hx: Pt has experimented in the past with marijuana and tobacco. Smokes marijuana twice per week.   Sexual activity: Never  Other Topics Concern   Not on file  Social History Narrative   Patient is the oldest of 8 children. There are a total of 5 children in the home, with him being the oldest. 2 siblings live  in [place], and 1 sibling has passed away. He lives in the home with his mom, step-dad, and 4 siblings. There is also a pet Israel pig in the home.   Social Determinants of Health   Financial Resource Strain: Not on file  Food Insecurity: Not on file  Transportation Needs: Not on file  Physical Activity: Not on file  Stress: Not on file  Social Connections: Not on file   Family History  Problem Relation Age of Onset   Healthy Mother    Diabetes Other    No Known Allergies Prior to Admission medications   Medication Sig Start Date End Date Taking? Authorizing Provider  ketorolac (TORADOL) 10 MG tablet Take 1 tablet (10 mg total) by mouth every 6 (six) hours as needed for moderate pain. Patient not taking: Reported on 01/09/2022 10/29/21   Montez Morita, PA-C  methocarbamol (ROBAXIN) 500 MG tablet Take 1 tablet (500 mg total) by mouth every 8 (eight) hours as needed for muscle spasms. Patient not taking: Reported on 01/09/2022 10/29/21   Montez Morita, PA-C  ondansetron (ZOFRAN-ODT) 4 MG disintegrating tablet Take 1 tablet (4 mg total) by mouth every 8 (eight) hours as needed. Patient not taking: Reported on 01/09/2022 10/29/21   Montez Morita, PA-C  oxyCODONE-acetaminophen (PERCOCET) 5-325 MG tablet Take 1-2 tablets by mouth every 6 (six) hours as needed. 12/06/22   Dahlia Client,  Robert, PA-C     Positive ROS: All other systems have been reviewed and were otherwise negative with the exception of those mentioned in the HPI and as above.  Physical Exam: General: Alert, no acute distress Cardiovascular: No pedal edema Respiratory: No cyanosis, no use of accessory musculature GI: No organomegaly, abdomen is soft and non-tender Skin: No lesions in the area of chief complaint Neurologic: Sensation intact distally Psychiatric: Patient is competent for consent with normal mood and affect Lymphatic: No axillary or cervical lymphadenopathy  MUSCULOSKELETAL: TTP right clavicle, limited shoulder ROM tolerated,  deformity present, edema present, NVI   Imaging: 2v xrays show an acute fracture deformity involving the distal right Clavicle with approximately 1 shaft width volar displacement of the distal fracture site   Assessment: RIGHT CLAVICLE FRACTURE  Plan: Plan for Procedure(s): OPEN REDUCTION INTERNAL FIXATION (ORIF) CLAVICULAR FRACTURE  The risks benefits and alternatives were discussed with the patient including but not limited to the risks of nonoperative treatment, versus surgical intervention including infection, bleeding, nerve injury,  blood clots, cardiopulmonary complications, morbidity, mortality, among others, and they were willing to proceed.   Weightbearing: NWB RUE Orthopedic devices: sling Showering: POD 3 Dressing: reinforce PRN Medicines: Oxy, Tylenol, Naproxen, Baclofen, Zofran  Discharge: home Follow up: 12/22/22 at 11:15am    Jenne Pane, PA-C Office [phone] 12/06/2022 5:37 PM
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
## 2022-12-07 NOTE — Progress Notes (Addendum)
LMTCB x 3 for [name]    The patient was identified using 2 approved identifiers. All issues noted in this document were discussed and addressed, Mr [name] voiced understanding and agreement with all preoperative instructions. The patient was emailed the surgery instructions per his / her request.      The patient was instructed to call our Pharmacy [phone]) and the Admitting Office [phone] or [phone]) to complete their Pre-surgical Interview.     COVID Vaccine received:  []  No []  Yes Date of any COVID positive Test in last 90 days:  PCP - [name], MD Cardiologist - None  Chest x-ray - 12-05-2022  1v  Epic EKG -  (01-09-2022) Epic Stress Test -  ECHO -  Cardiac Cath -   PCR screen: []  Ordered & Completed           []   No Order but Needs PROFEND           [x]   N/A for this surgery  Surgery Plan:  [x]  Ambulatory                            []  Outpatient in bed                            []  Admit  Anesthesia:    []  General  []  Spinal                           [x]   Choice []   MAC  Pacemaker / ICD device [x]  No []  Yes   Spinal Cord Stimulator:[x]  No []  Yes       History of Sleep Apnea? [x]  No []  Yes   CPAP used?- [x]  No []  Yes    Does the patient monitor blood sugar?          []  No []  Yes  [x]  N/A  Patient has: [x]  NO Hx DM   []  Pre-DM                 []  DM1  []   DM2  Blood Thinner / Instructions: none Aspirin Instructions:  none  ERAS Protocol Ordered: []  No  [x]  Yes PRE-SURGERY [x]  ENSURE  []  G2  Patient is to be NPO after: 10:30  Comments: This was a Telephone PST interview and so the patient did not get his Benzoyl Peroxide gel.  Activity level: Patient is able climb a flight of stairs without difficulty; [x]  No CP  [x]  No SOB.  Patient can perform ADLs without assistance.   Anesthesia review: ADHD, no other pertinent hx  Patient denies shortness of breath, fever, cough and chest pain at PAT appointment.  Patient verbalized  understanding and agreement to the Pre-Surgical Instructions that were given to them at this PAT appointment. Patient was also educated of the need to review these PAT instructions again prior to his surgery.I reviewed the appropriate phone numbers to call if they have any and questions or concerns.

## 2022-12-07 NOTE — Patient Instructions (Signed)
SURGICAL WAITING ROOM VISITATION Patients having surgery or a procedure may have no more than 2 support people in the waiting area - these visitors may rotate in the visitor waiting room.   Due to an increase in RSV and influenza rates and associated hospitalizations, children ages 31 and under may not visit patients in [facility] hospitals. If the patient needs to stay at the hospital during part of their recovery, the visitor guidelines for inpatient rooms apply.  PRE-OP VISITATION  Pre-op nurse will coordinate an appropriate time for 1 support person to accompany the patient in pre-op.  This support person may not rotate.  This visitor will be contacted when the time is appropriate for the visitor to come back in the pre-op area.  Please refer to the [facility] website for the visitor guidelines for Inpatients (after your surgery is over and you are in a regular room).  You are not required to quarantine at this time prior to your surgery. However, you must do this: Hand Hygiene often Do NOT share personal items Notify your provider if you are in close contact with someone who has COVID or you develop fever 100.4 or greater, new onset of sneezing, cough, sore throat, shortness of breath or body aches.  If you test positive for Covid or have been in contact with anyone that has tested positive in the last 10 days please notify you surgeon.    Your procedure is scheduled on: Tuesday  December 12, 2022   Report to [facility] Main Entrance: [place] entrance where the [employer] is available.   Report to admitting at: 11:15    AM  Call this number if you have any questions or problems the morning of surgery [phone]  Do not eat food after Midnight the night prior to your surgery/procedure.  After Midnight you may have the following liquids until   10:30  AM  DAY OF SURGERY  Clear Liquid Diet Water Black Coffee (sugar ok, NO MILK/CREAM OR CREAMERS)  Tea (sugar ok, NO  MILK/CREAM OR CREAMERS) regular and decaf                             Plain Jell-O  with no fruit (NO RED)                                           Fruit ices (not with fruit pulp, NO RED)                                     Popsicles (NO RED)                                                                  Juice: NO CITRUS JUICES: only apple, WHITE grape, WHITE cranberry Sports drinks like Gatorade or Powerade (NO RED)                    The day of surgery:  Drink ONE (1) Pre-Surgery Clear Ensure at  10:30  AM  the morning of surgery. Drink in one sitting. Do not sip.  This drink was given to you during your hospital pre-op appointment visit. Nothing else to drink after completing the Pre-Surgery Clear Ensure : No candy, chewing gum or throat lozenges.    FOLLOW  ANY ADDITIONAL PRE OP INSTRUCTIONS YOU RECEIVED FROM YOUR SURGEON'S OFFICE!!!   Oral Hygiene is also important to reduce your risk of infection.        Remember - BRUSH YOUR TEETH THE MORNING OF SURGERY WITH YOUR REGULAR TOOTHPASTE  Do NOT smoke after Midnight the night before surgery.  Take ONLY these medicines the morning of surgery with A SIP OF WATER: percocet if needed for pain.                    You may not have any metal on your body including  jewelry, and body piercing  Do not wear  lotions, powders,cologne, or deodorant  Men may shave face and neck.  Patients discharged on the day of surgery will not be allowed to drive home.  Someone NEEDS to stay with you for the first 24 hours after anesthesia.  Do not bring your home medications to the hospital. The Pharmacy will dispense medications listed on your medication list to you during your admission in the Hospital.  Please read over the following fact sheets you were given: IF YOU HAVE QUESTIONS ABOUT YOUR PRE-OP INSTRUCTIONS, PLEASE CALL [phone]   [facility] - Preparing for Surgery Before surgery, you can play an important role.  Because skin is not  sterile, your skin needs to be as free of germs as possible.  You can reduce the number of germs on your skin by washing with CHG (chlorahexidine gluconate) soap before surgery.  CHG is an antiseptic cleaner which kills germs and bonds with the skin to continue killing germs even after washing. Please DO NOT use if you have an allergy to CHG or antibacterial soaps.  If your skin becomes reddened/irritated stop using the CHG and inform your nurse when you arrive at Short Stay. Do not shave (including legs and underarms) for at least 48 hours prior to the first CHG shower.  You may shave your face/neck.  Please follow these instructions carefully:  1.  Shower with CHG Soap the night before surgery and the  morning of surgery.  2.  If you choose to wash your hair, wash your hair first as usual with your normal  shampoo.  3.  After you shampoo, rinse your hair and body thoroughly to remove the shampoo.                             4.  Use CHG as you would any other liquid soap.  You can apply chg directly to the skin and wash.  Gently with a scrungie or clean washcloth.  5.  Apply the CHG Soap to your body ONLY FROM THE NECK DOWN.   Do not use on face/ open                           Wound or open sores. Avoid contact with eyes, ears mouth and genitals (private parts).                       Wash face,  Genitals (private parts) with your normal soap.  6.  Wash thoroughly, paying special attention to the area where your  surgery  will be performed.  7.  Thoroughly rinse your body with warm water from the neck down.  8.  DO NOT shower/wash with your normal soap after using and rinsing off the CHG Soap.            9.  Pat yourself dry with a clean towel.            10.  Wear clean pajamas.            11.  Place clean sheets on your bed the night of your first shower and do not  sleep with pets.  ON THE DAY OF SURGERY : Do not apply any lotions/deodorants the morning of surgery.  Please wear  clean clothes to the hospital/surgery center.   FAILURE TO FOLLOW THESE INSTRUCTIONS MAY RESULT IN THE CANCELLATION OF YOUR SURGERY  PATIENT SIGNATURE_________________________________  NURSE SIGNATURE__________________________________  ________________________________________________________________________

## 2022-12-08 ENCOUNTER — Inpatient Hospital Stay (HOSPITAL_COMMUNITY)
Admission: RE | Admit: 2022-12-08 | Discharge: 2022-12-08 | Disposition: A | Discharge status: Home / Self Care | Payer: Medicaid Other | Source: Ambulatory Visit

## 2022-12-08 DIAGNOSIS — Z01818 Encounter for other preprocedural examination: Secondary | ICD-10-CM

## 2022-12-11 ENCOUNTER — Encounter (HOSPITAL_COMMUNITY): Payer: Self-pay | Admitting: Orthopedic Surgery

## 2022-12-11 ENCOUNTER — Other Ambulatory Visit: Payer: Self-pay

## 2022-12-11 NOTE — Anesthesia Preprocedure Evaluation (Signed)
Anesthesia Evaluation  Patient identified by MRN, date of birth, ID band Patient awake    Reviewed: Allergy & Precautions, NPO status , Patient's Chart, lab work & pertinent test results  Airway Mallampati: III  TM Distance: >3 FB Neck ROM: Full    Dental  (+) Teeth Intact, Dental Advisory Given Front upper teeth implants:   Pulmonary Current Smoker and Patient abstained from smoking. 2 cigars/d    Pulmonary exam normal breath sounds clear to auscultation       Cardiovascular negative cardio ROS Normal cardiovascular exam Rhythm:Regular Rate:Normal     Neuro/Psych  PSYCHIATRIC DISORDERS  Depression    negative neurological ROS     GI/Hepatic negative GI ROS,,,(+)     substance abuse (daily marijuana- rolls his own)  marijuana use  Endo/Other  negative endocrine ROS    Renal/GU negative Renal ROS  negative genitourinary   Musculoskeletal negative musculoskeletal ROS (+)    Abdominal   Peds  Hematology negative hematology ROS (+)   Anesthesia Other Findings Has been taking percocet 5mg  for pain since fracture TID-QID  Reproductive/Obstetrics negative OB ROS                             Anesthesia Physical Anesthesia Plan  ASA: 2  Anesthesia Plan: General and Regional   Post-op Pain Management: Regional block*, Tylenol PO (pre-op)* and Toradol IV (intra-op)*   Induction: Intravenous  PONV Risk Score and Plan: 1 and Ondansetron, Dexamethasone, Midazolam and Treatment may vary due to age or medical condition  Airway Management Planned: Oral ETT  Additional Equipment: None  Intra-op Plan:   Post-operative Plan: Extubation in OR  Informed Consent: I have reviewed the patients History and Physical, chart, labs and discussed the procedure including the risks, benefits and alternatives for the proposed anesthesia with the patient or authorized representative who has indicated his/her  understanding and acceptance.     Dental advisory given  Plan Discussed with: CRNA  Anesthesia Plan Comments: (BIS)       Anesthesia Quick Evaluation

## 2022-12-11 NOTE — Progress Notes (Signed)
For Anesthesia: PCP - [name], MD  Cardiologist - N/A  Chest x-ray - 12/06/22 in [facility] EKG - 01/09/22 in [facility] Stress Test - N/A ECHO - N/A Cardiac Cath - N/A Pacemaker/ICD device last checked:N/A Pacemaker orders received:N/A Device Rep notified:N/A  Spinal Cord Stimulator:  N/A  Sleep Study - N/A CPAP -   Fasting Blood Sugar - N/A Checks Blood Sugar _N/A____ times a day Date and result of last Hgb A1c-N/A  Last dose of GLP1 agonist- N/A GLP1 instructions: N/A  Last dose of SGLT-2 inhibitors- N/A SGLT-2 instructions:N/A  Blood Thinner Instructions:N/A Aspirin Instructions:N/A Last Dose:N/A  Activity level:  Able to exercise without chest pain and/or shortness of breath    Anesthesia review: N/A  Patient denies shortness of breath, fever, cough and chest pain during pre op phone call.   Patient verbalized understanding of instructions reviewed via telephone.

## 2022-12-11 NOTE — Discharge Instructions (Signed)
Apply ice to reduce pain and swelling.  Diet: As you were doing prior to hospitalization   Dressing:  Keep dressing on and dry until follow up.  Activity:  Increase activity slowly as tolerated, but follow the weight bearing instructions below.  The rules on driving is that you can not be taking narcotics while you drive, and you must feel in control of the vehicle.    Weight Bearing: Non weight bearing affected arm.  Maintain sling at all times.    Medicines: - Tylenol is for mild to moderate pain relief. - Naproxen is for pain and inflammation. - Oxycodone is a narcotic for severe pain relief. Take this as little as possible and stop it as soon as possible. - Baclofen is for muscle spasms. This medicine can make you drowsy. - Zofran is for nausea and vomiting.  To prevent constipation: you may use a stool softener such as -  Colace (over the counter) 100 mg by mouth twice a day  Drink plenty of fluids (prune juice may be helpful) and high fiber foods Miralax (over the counter) for constipation as needed.    Itching:  If you experience itching with your medications, try taking only a single pain pill, or even half a pain pill at a time.  You can also use benadryl over the counter for itching or also to help with sleep.   Precautions:  If you experience chest pain or shortness of breath - call 911 immediately for transfer to the hospital emergency department!!  If you develop a fever greater that 101 F, purulent drainage from wound, increased redness or drainage from wound, or calf pain -- Call the office at [phone]                                                 Follow- Up Appointment:  Please call for an appointment to be seen in 2 weeks - [phone]

## 2022-12-12 ENCOUNTER — Ambulatory Visit (HOSPITAL_COMMUNITY): Payer: Medicaid Other | Admitting: Anesthesiology

## 2022-12-12 ENCOUNTER — Encounter (HOSPITAL_COMMUNITY)
Admission: RE | Disposition: A | Discharge status: Home / Self Care | Payer: Self-pay | Source: Home / Self Care | Attending: Orthopedic Surgery

## 2022-12-12 ENCOUNTER — Other Ambulatory Visit: Payer: Self-pay

## 2022-12-12 ENCOUNTER — Ambulatory Visit (HOSPITAL_BASED_OUTPATIENT_CLINIC_OR_DEPARTMENT_OTHER): Payer: Medicaid Other | Admitting: Anesthesiology

## 2022-12-12 ENCOUNTER — Encounter (HOSPITAL_COMMUNITY): Payer: Self-pay | Admitting: Orthopedic Surgery

## 2022-12-12 ENCOUNTER — Ambulatory Visit (HOSPITAL_COMMUNITY)
Admission: RE | Admit: 2022-12-12 | Discharge: 2022-12-12 | Disposition: A | Discharge status: Home / Self Care | Payer: Medicaid Other | Attending: Orthopedic Surgery | Admitting: Orthopedic Surgery

## 2022-12-12 ENCOUNTER — Ambulatory Visit (HOSPITAL_COMMUNITY): Payer: Medicaid Other

## 2022-12-12 DIAGNOSIS — Z7722 Contact with and (suspected) exposure to environmental tobacco smoke (acute) (chronic): Secondary | ICD-10-CM | POA: Diagnosis not present

## 2022-12-12 DIAGNOSIS — S42001A Fracture of unspecified part of right clavicle, initial encounter for closed fracture: Secondary | ICD-10-CM

## 2022-12-12 DIAGNOSIS — X58XXXA Exposure to other specified factors, initial encounter: Secondary | ICD-10-CM | POA: Insufficient documentation

## 2022-12-12 DIAGNOSIS — Z01818 Encounter for other preprocedural examination: Secondary | ICD-10-CM

## 2022-12-12 DIAGNOSIS — G8918 Other acute postprocedural pain: Secondary | ICD-10-CM | POA: Diagnosis not present

## 2022-12-12 HISTORY — PX: ORIF CLAVICULAR FRACTURE: SHX5055

## 2022-12-12 SURGERY — OPEN REDUCTION INTERNAL FIXATION (ORIF) CLAVICULAR FRACTURE
Anesthesia: Regional | Site: Shoulder | Laterality: Right

## 2022-12-12 MED ORDER — FENTANYL CITRATE (PF) 100 MCG/2ML IJ SOLN
INTRAMUSCULAR | Status: DC | PRN
  Administered 2022-12-12: 100 ug via INTRAVENOUS

## 2022-12-12 MED ORDER — MEPERIDINE HCL 50 MG/ML IJ SOLN: 6.2500 mg | INTRAMUSCULAR | Status: DC | PRN

## 2022-12-12 MED ORDER — KETOROLAC TROMETHAMINE 30 MG/ML IJ SOLN
INTRAMUSCULAR | Status: AC
  Filled 2022-12-12: qty 1

## 2022-12-12 MED ORDER — ORAL CARE MOUTH RINSE: 15.0000 mL | Freq: Once | OROMUCOSAL | Status: AC

## 2022-12-12 MED ORDER — LIDOCAINE HCL (CARDIAC) PF 100 MG/5ML IV SOSY
PREFILLED_SYRINGE | INTRAVENOUS | Status: DC | PRN
  Administered 2022-12-12: 100 mg via INTRAVENOUS

## 2022-12-12 MED ORDER — 0.9 % SODIUM CHLORIDE (POUR BTL) OPTIME
TOPICAL | Status: DC | PRN
  Administered 2022-12-12: 1000 mL

## 2022-12-12 MED ORDER — LIDOCAINE HCL (PF) 2 % IJ SOLN
INTRAMUSCULAR | Status: AC
  Filled 2022-12-12: qty 5

## 2022-12-12 MED ORDER — HYDROMORPHONE HCL 1 MG/ML IJ SOLN
0.2500 mg | INTRAMUSCULAR | Status: DC | PRN
  Administered 2022-12-12 (×3): 0.5 mg via INTRAVENOUS

## 2022-12-12 MED ORDER — SUGAMMADEX SODIUM 200 MG/2ML IV SOLN
INTRAVENOUS | Status: DC | PRN
  Administered 2022-12-12: 80 mg via INTRAVENOUS

## 2022-12-12 MED ORDER — ROCURONIUM BROMIDE 10 MG/ML (PF) SYRINGE
PREFILLED_SYRINGE | INTRAVENOUS | Status: AC
  Filled 2022-12-12: qty 10

## 2022-12-12 MED ORDER — OXYCODONE HCL 5 MG PO TABS: ORAL_TABLET | ORAL | 0 refills | Status: AC

## 2022-12-12 MED ORDER — PROPOFOL 10 MG/ML IV BOLUS
INTRAVENOUS | Status: DC | PRN
Start: 2022-12-12 — End: 2022-12-12
  Administered 2022-12-12: 200 mg via INTRAVENOUS

## 2022-12-12 MED ORDER — PHENYLEPHRINE HCL-NACL 20-0.9 MG/250ML-% IV SOLN
INTRAVENOUS | Status: DC | PRN
  Administered 2022-12-12: 30 ug/min via INTRAVENOUS
  Administered 2022-12-12: 40 ug via INTRAVENOUS

## 2022-12-12 MED ORDER — CEFAZOLIN SODIUM-DEXTROSE 2-4 GM/100ML-% IV SOLN
2.0000 g | INTRAVENOUS | Status: AC
  Administered 2022-12-12: 2 g via INTRAVENOUS

## 2022-12-12 MED ORDER — FENTANYL CITRATE PF 50 MCG/ML IJ SOSY
100.0000 ug | PREFILLED_SYRINGE | Freq: Once | INTRAMUSCULAR | Status: AC
  Administered 2022-12-12: 100 ug via INTRAVENOUS
  Filled 2022-12-12: qty 2

## 2022-12-12 MED ORDER — NAPROXEN 500 MG PO TABS: 500.0000 mg | ORAL_TABLET | Freq: Two times a day (BID) | ORAL | 0 refills | Status: AC

## 2022-12-12 MED ORDER — MIDAZOLAM HCL 2 MG/2ML IJ SOLN
2.0000 mg | Freq: Once | INTRAMUSCULAR | Status: AC
  Administered 2022-12-12: 2 mg via INTRAVENOUS
  Filled 2022-12-12: qty 2

## 2022-12-12 MED ORDER — POVIDONE-IODINE 10 % EX SWAB: 2.0000 | Freq: Once | CUTANEOUS | Status: DC

## 2022-12-12 MED ORDER — ROCURONIUM BROMIDE 100 MG/10ML IV SOLN
INTRAVENOUS | Status: DC | PRN
  Administered 2022-12-12: 30 mg via INTRAVENOUS

## 2022-12-12 MED ORDER — FENTANYL CITRATE (PF) 100 MCG/2ML IJ SOLN
INTRAMUSCULAR | Status: AC
  Filled 2022-12-12: qty 2

## 2022-12-12 MED ORDER — BUPIVACAINE LIPOSOME 1.3 % IJ SUSP
INTRAMUSCULAR | Status: DC | PRN
  Administered 2022-12-12: 10 mL via PERINEURAL

## 2022-12-12 MED ORDER — OXYCODONE HCL 5 MG PO TABS: 5.0000 mg | ORAL_TABLET | Freq: Once | ORAL | Status: AC | PRN

## 2022-12-12 MED ORDER — ONDANSETRON HCL 4 MG/2ML IJ SOLN
INTRAMUSCULAR | Status: DC | PRN
  Administered 2022-12-12: 4 mg via INTRAVENOUS

## 2022-12-12 MED ORDER — ONDANSETRON HCL 4 MG/2ML IJ SOLN
INTRAMUSCULAR | Status: AC
  Filled 2022-12-12: qty 2

## 2022-12-12 MED ORDER — AMISULPRIDE (ANTIEMETIC) 5 MG/2ML IV SOLN: 10.0000 mg | Freq: Once | INTRAVENOUS | Status: AC | PRN

## 2022-12-12 MED ORDER — CEFAZOLIN SODIUM-DEXTROSE 2-4 GM/100ML-% IV SOLN
INTRAVENOUS | Status: AC
  Filled 2022-12-12: qty 100

## 2022-12-12 MED ORDER — DEXAMETHASONE SODIUM PHOSPHATE 10 MG/ML IJ SOLN
INTRAMUSCULAR | Status: AC
  Filled 2022-12-12: qty 1

## 2022-12-12 MED ORDER — OXYCODONE HCL 5 MG/5ML PO SOLN: 5.0000 mg | Freq: Once | ORAL | Status: AC | PRN

## 2022-12-12 MED ORDER — DEXAMETHASONE SODIUM PHOSPHATE 10 MG/ML IJ SOLN
8.0000 mg | Freq: Once | INTRAMUSCULAR | Status: AC
  Administered 2022-12-12: 8 mg via INTRAVENOUS

## 2022-12-12 MED ORDER — BUPIVACAINE-EPINEPHRINE (PF) 0.5% -1:200000 IJ SOLN
INTRAMUSCULAR | Status: AC
  Filled 2022-12-12: qty 30

## 2022-12-12 MED ORDER — ACETAMINOPHEN 500 MG PO TABS: 1000.0000 mg | ORAL_TABLET | Freq: Three times a day (TID) | ORAL | 0 refills | Status: AC

## 2022-12-12 MED ORDER — ACETAMINOPHEN 500 MG PO TABS
ORAL_TABLET | ORAL | Status: AC
  Administered 2022-12-12: 1000 mg via ORAL
  Filled 2022-12-12: qty 2

## 2022-12-12 MED ORDER — ROPIVACAINE HCL 5 MG/ML IJ SOLN
INTRAMUSCULAR | Status: DC | PRN
  Administered 2022-12-12: 15 mL via PERINEURAL

## 2022-12-12 MED ORDER — CHLORHEXIDINE GLUCONATE 0.12 % MT SOLN
15.0000 mL | Freq: Once | OROMUCOSAL | Status: AC
  Administered 2022-12-12: 15 mL via OROMUCOSAL

## 2022-12-12 MED ORDER — AMISULPRIDE (ANTIEMETIC) 5 MG/2ML IV SOLN
INTRAVENOUS | Status: AC
  Administered 2022-12-12: 10 mg via INTRAVENOUS
  Filled 2022-12-12: qty 4

## 2022-12-12 MED ORDER — HYDROMORPHONE HCL 1 MG/ML IJ SOLN
INTRAMUSCULAR | Status: AC
  Administered 2022-12-12: 0.5 mg via INTRAVENOUS
  Filled 2022-12-12: qty 2

## 2022-12-12 MED ORDER — BACLOFEN 5 MG PO TABS: 5.0000 mg | ORAL_TABLET | Freq: Three times a day (TID) | ORAL | 0 refills | Status: DC | PRN

## 2022-12-12 MED ORDER — PROPOFOL 10 MG/ML IV BOLUS
INTRAVENOUS | Status: AC
  Filled 2022-12-12: qty 20

## 2022-12-12 MED ORDER — DEXMEDETOMIDINE HCL IN NACL 80 MCG/20ML IV SOLN
INTRAVENOUS | Status: DC | PRN
  Administered 2022-12-12: 20 ug via INTRAVENOUS

## 2022-12-12 MED ORDER — ONDANSETRON HCL 4 MG PO TABS: 4.0000 mg | ORAL_TABLET | Freq: Three times a day (TID) | ORAL | 0 refills | Status: AC | PRN

## 2022-12-12 MED ORDER — OXYCODONE HCL 5 MG PO TABS
ORAL_TABLET | ORAL | Status: AC
  Administered 2022-12-12: 5 mg via ORAL
  Filled 2022-12-12: qty 1

## 2022-12-12 MED ORDER — KETOROLAC TROMETHAMINE 30 MG/ML IJ SOLN
30.0000 mg | Freq: Once | INTRAMUSCULAR | Status: AC | PRN
  Administered 2022-12-12: 30 mg via INTRAVENOUS

## 2022-12-12 MED ORDER — ONDANSETRON HCL 4 MG/2ML IJ SOLN: 4.0000 mg | Freq: Once | INTRAMUSCULAR | Status: DC | PRN

## 2022-12-12 MED ORDER — ACETAMINOPHEN 500 MG PO TABS: 1000.0000 mg | ORAL_TABLET | Freq: Once | ORAL | Status: AC

## 2022-12-12 MED ORDER — LACTATED RINGERS IV SOLN: INTRAVENOUS | Status: DC

## 2022-12-12 SURGICAL SUPPLY — 54 items
BAG COUNTER SPONGE SURGICOUNT (BAG) ×1 IMPLANT
BAG SPNG CNTER NS LX DISP (BAG) ×1
BIT DRILL 122X2.7XOVR NS (BIT) IMPLANT
BIT DRILL 135X2XAO FIT SCL (BIT) IMPLANT
BIT DRILL 2.0 (BIT) ×1
BIT DRILL 2.6 (BIT) ×1
BIT DRILL 2.6X VARIAX 2 (BIT) IMPLANT
BIT DRILL 2.7 (BIT) ×1
BIT DRILL LONG 2.6X220 (BIT) IMPLANT
BIT DRL 122X2.7XOVR NS (BIT) ×1
BIT DRL 135X2XAO FIT SCL (BIT) ×1
BIT DRL 2.6X VARIAX 2 (BIT) ×1
CLEANER TIP ELECTROSURG 2X2 (MISCELLANEOUS) ×1 IMPLANT
CLSR STERI-STRIP ANTIMIC 1/2X4 (GAUZE/BANDAGES/DRESSINGS) IMPLANT
DRAPE C-ARM 42X120 X-RAY (DRAPES) ×1 IMPLANT
DRAPE IMP U-DRAPE 54X76 (DRAPES) ×1 IMPLANT
DRAPE ORTHO SPLIT 77X108 STRL (DRAPES) ×2
DRAPE SURG ORHT 6 SPLT 77X108 (DRAPES) ×2 IMPLANT
DRAPE U-SHAPE 47X51 STRL (DRAPES) ×2 IMPLANT
DRSG AQUACEL AG ADV 3.5X 6 (GAUZE/BANDAGES/DRESSINGS) IMPLANT
DRSG EMULSION OIL 3X3 NADH (GAUZE/BANDAGES/DRESSINGS) ×1 IMPLANT
DRSG MEPILEX POST OP 4X8 (GAUZE/BANDAGES/DRESSINGS) ×1 IMPLANT
DURAPREP 26ML APPLICATOR (WOUND CARE) ×1 IMPLANT
ELECT REM PT RETURN 15FT ADLT (MISCELLANEOUS) ×1 IMPLANT
GAUZE SPONGE 4X4 12PLY STRL (GAUZE/BANDAGES/DRESSINGS) ×1 IMPLANT
GLOVE BIO SURGEON STRL SZ7.5 (GLOVE) ×2 IMPLANT
GLOVE BIOGEL PI IND STRL 7.5 (GLOVE) ×1 IMPLANT
GLOVE BIOGEL PI IND STRL 8 (GLOVE) ×1 IMPLANT
GOWN STRL REUS W/ TWL LRG LVL3 (GOWN DISPOSABLE) ×3 IMPLANT
GOWN STRL REUS W/TWL LRG LVL3 (GOWN DISPOSABLE) ×3
KIT BASIN OR (CUSTOM PROCEDURE TRAY) ×1 IMPLANT
KIT TURNOVER KIT A (KITS) IMPLANT
MANIFOLD NEPTUNE II (INSTRUMENTS) ×1 IMPLANT
NDL HYPO 25X1 1.5 SAFETY (NEEDLE) ×1 IMPLANT
NEEDLE HYPO 25X1 1.5 SAFETY (NEEDLE) ×1 IMPLANT
NS IRRIG 1000ML POUR BTL (IV SOLUTION) ×1 IMPLANT
PACK SHOULDER (CUSTOM PROCEDURE TRAY) ×1 IMPLANT
PAD ARMBOARD 7.5X6 YLW CONV (MISCELLANEOUS) ×2 IMPLANT
PLATE RIGHT MIDSHAFT SUPER 6H (Plate) IMPLANT
SCREW BN T10 FT 14X3.5XSTRDR (Screw) IMPLANT
SCREW BONE 2.7X16 NON-LOCKING (Screw) IMPLANT
SCREW BONE 3.5X14MM (Screw) ×6 IMPLANT
SLING ARM FOAM STRAP LRG (SOFTGOODS) ×1 IMPLANT
SPONGE T-LAP 4X18 ~~LOC~~+RFID (SPONGE) ×2 IMPLANT
STRIP CLOSURE SKIN 1/2X4 (GAUZE/BANDAGES/DRESSINGS) ×2 IMPLANT
SUCTION TUBE FRAZIER 10FR DISP (SUCTIONS) ×1 IMPLANT
SUT MNCRL AB 4-0 PS2 18 (SUTURE) ×1 IMPLANT
SUT MON AB 2-0 CT1 36 (SUTURE) ×1 IMPLANT
SUT VIC AB 3-0 SH 27 (SUTURE) ×1
SUT VIC AB 3-0 SH 27X BRD (SUTURE) IMPLANT
SYR CONTROL 10ML LL (SYRINGE) ×1 IMPLANT
TOWEL OR 17X26 10 PK STRL BLUE (TOWEL DISPOSABLE) ×1 IMPLANT
TOWEL OR NON WOVEN STRL DISP B (DISPOSABLE) ×1 IMPLANT
WATER STERILE IRR 1000ML POUR (IV SOLUTION) ×1 IMPLANT

## 2022-12-12 NOTE — Op Note (Signed)
12/12/2022  12:26 PM  PATIENT:  [name]    PRE-OPERATIVE DIAGNOSIS:  RIGHT CLAVICLE FRACTURE  POST-OPERATIVE DIAGNOSIS:  Same  PROCEDURE:  OPEN REDUCTION INTERNAL FIXATION (ORIF) CLAVICULAR FRACTURE  SURGEON:  Sheral Apley, MD  PHYSICIAN ASSISTANT: Levester Fresh, PA-C, he was present and scrubbed throughout the case, critical for completion in a timely fashion, and for retraction, instrumentation, and closure.   ANESTHESIA:   General  PREOPERATIVE INDICATIONS:  [name] is a  22 y.o. male with a diagnosis of RIGHT CLAVICLE FRACTURE who elected for surgical management based on preoperative shortening and angulation and displacement of the fracture.    The risks benefits and alternatives were discussed with the patient preoperatively including but not limited to the risks of infection, bleeding, nerve injury, malunion, nonunion, hardware failure, the need for hardware removal, recurrent fracture, cardiopulmonary complications, the need for revision surgery, among others, and the patient was willing to proceed.    OPERATIVE IMPLANTS: stryker 6 hole clavicle plate  OPERATIVE FINDINGS: Shortened, displaced clavicle fracture  OPERATIVE PROCEDURE: The patient was brought to the operating room and placed in the supine position. General anesthesia was administered. IV antibiotics were given. He was placed in the beach chair position. The upper extremity was prepped and draped in the usual sterile fashion. Time out was performed. Incision was made over the clavicle fracture. Dissection was carried down through the platysma, and the fracture site exposed. The fracture was extremely short.  I ultimately did however achieve satisfactory mobilization, and was able to reduce the fracture anatomically.   I placed a lag screw across the fracture followed by a 6 hole plate on the superior surface.   I had excellent bony apposition and restoration of anatomic alignment of the clavicle.  Used C-arm to confirm appropriate alignment, reduction of the fracture, and positioning of the plate and length of the screws.  I then took final C-arm pictures, irrigated the wounds copiously, and repaired the fascia with inverted figure-of-eight Vicryl suture. The subcutaneous tissue was closed with Vicryl as well, and the skin closed with steri-strips, and the patient was awakened and returned to the PACU in stable and satisfactory condition. There were no complications.   POSTOPERATIVE PLAN: Sling full time, DVT px: ambulation and mobilization and chemical px

## 2022-12-12 NOTE — Anesthesia Postprocedure Evaluation (Signed)
Anesthesia Post Note  Patient: [name]  Procedure(s) Performed: OPEN REDUCTION INTERNAL FIXATION (ORIF) CLAVICULAR FRACTURE (Right: Shoulder)     Patient location during evaluation: PACU Anesthesia Type: Regional and General Level of consciousness: awake and alert Pain management: pain level controlled Vital Signs Assessment: post-procedure vital signs reviewed and stable Respiratory status: spontaneous breathing, nonlabored ventilation, respiratory function stable and patient connected to nasal cannula oxygen Cardiovascular status: blood pressure returned to baseline and stable Postop Assessment: no apparent nausea or vomiting Anesthetic complications: no   No notable events documented.  Last Vitals:  Vitals:   12/12/22 1400 12/12/22 1415  BP: 111/70 122/64  Pulse: 60 (!) 58  Resp: 14 16  Temp:  36.6 C  SpO2: 98% 97%    Last Pain:  Vitals:   12/12/22 1415  TempSrc:   PainSc: 3                  [name]

## 2022-12-12 NOTE — OR Nursing (Signed)
Patient complains of L hand and wrist pain that started at the same time as the clavicle fx. Wanting to know if there is anything that can be done for L hand today. Advised Dr. Eulah Pont of the above and states he will speak with the patient.

## 2022-12-12 NOTE — Transfer of Care (Signed)
Immediate Anesthesia Transfer of Care Note  Patient: [name]  Procedure(s) Performed: OPEN REDUCTION INTERNAL FIXATION (ORIF) CLAVICULAR FRACTURE (Right: Shoulder)  Patient Location: PACU  Anesthesia Type:GA combined with regional for post-op pain  Level of Consciousness: awake, oriented, and sedated  Airway & Oxygen Therapy: Patient Spontanous Breathing and Patient connected to face mask oxygen  Post-op Assessment: Report given to RN and Post -op Vital signs reviewed and stable  Post vital signs: Reviewed and stable  Last Vitals:  Vitals Value Taken Time  BP 135/80 12/12/22 1315  Temp 36.3 C 12/12/22 1309  Pulse 66 12/12/22 1322  Resp 15 12/12/22 1322  SpO2 98 % 12/12/22 1322  Vitals shown include unfiled device data.  Last Pain:  Vitals:   12/12/22 1315  TempSrc:   PainSc: 0-No pain         Complications: No notable events documented.

## 2022-12-12 NOTE — Progress Notes (Signed)
Orthopedic Tech Progress Note Patient Details:  [name] 2000-08-01 [id number]  Ortho Devices Type of Ortho Device: Thumb velcro splint Ortho Device/Splint Location: left Ortho Device/Splint Interventions: Ordered, Application, Adjustment   Post Interventions Patient Tolerated: Well Instructions Provided: Adjustment of device, Care of device  Kizzie Fantasia 12/12/2022, 3:24 PM

## 2022-12-12 NOTE — Anesthesia Procedure Notes (Signed)
Procedure Name: Intubation Date/Time: 12/12/2022 11:59 AM  Performed by: Rise Patience, CRNAPre-anesthesia Checklist: Patient identified, Emergency Drugs available, Suction available and Patient being monitored Patient Re-evaluated:Patient Re-evaluated prior to induction Oxygen Delivery Method: Circle System Utilized Preoxygenation: Pre-oxygenation with 100% oxygen Induction Type: IV induction Ventilation: Mask ventilation without difficulty Laryngoscope Size: Mac and 4 Grade View: Grade II Tube type: Oral Tube size: 7.5 mm Number of attempts: 1 Airway Equipment and Method: Stylet and Oral airway Placement Confirmation: ETT inserted through vocal cords under direct vision, positive ETCO2 and breath sounds checked- equal and bilateral Secured at: 22 cm Tube secured with: Tape Dental Injury: Teeth and Oropharynx as per pre-operative assessment

## 2022-12-12 NOTE — Interval H&P Note (Signed)
History and Physical Interval Note:  12/12/2022 11:32 AM  [name]  has presented today for surgery, with the diagnosis of RIGHT CLAVICLE FRACTURE.  The various methods of treatment have been discussed with the patient and family. After consideration of risks, benefits and other options for treatment, the patient has consented to  Procedure(s): OPEN REDUCTION INTERNAL FIXATION (ORIF) CLAVICULAR FRACTURE (Right) as a surgical intervention.  The patient's history has been reviewed, patient examined, no change in status, stable for surgery.  I have reviewed the patient's chart and labs.  Questions were answered to the patient's satisfaction.     Sheral Apley

## 2022-12-12 NOTE — Anesthesia Procedure Notes (Signed)
Anesthesia Regional Block: Interscalene brachial plexus block   Pre-Anesthetic Checklist: , timeout performed,  Correct Patient, Correct Site, Correct Laterality,  Correct Procedure, Correct Position, site marked,  Risks and benefits discussed,  Surgical consent,  Pre-op evaluation,  At surgeon's request and post-op pain management  Laterality: Right  Prep: Maximum Sterile Barrier Precautions used, chloraprep       Needles:  Injection technique: Single-shot  Needle Type: Echogenic Stimulator Needle     Needle Length: 9cm  Needle Gauge: 22     Additional Needles:   Procedures:,,,, ultrasound used (permanent image in chart),,    Narrative:  Start time: 12/12/2022 10:20 AM End time: 12/12/2022 10:25 AM Injection made incrementally with aspirations every 5 mL.  Performed by: Personally  Anesthesiologist: Lannie Fields, DO  Additional Notes: Monitors applied. No increased pain on injection. No increased resistance to injection. Injection made in 5cc increments. Good needle visualization. Patient tolerated procedure well.

## 2022-12-22 ENCOUNTER — Encounter (HOSPITAL_COMMUNITY): Payer: Self-pay | Admitting: Orthopedic Surgery

## 2022-12-22 DIAGNOSIS — S42001D Fracture of unspecified part of right clavicle, subsequent encounter for fracture with routine healing: Secondary | ICD-10-CM | POA: Diagnosis not present

## 2023-05-16 ENCOUNTER — Other Ambulatory Visit: Payer: Self-pay

## 2023-05-16 ENCOUNTER — Emergency Department (HOSPITAL_COMMUNITY)
Admission: EM | Admit: 2023-05-16 | Discharge: 2023-05-16 | Disposition: A | Discharge status: Home / Self Care | Payer: Medicaid Other | Attending: Emergency Medicine | Admitting: Emergency Medicine

## 2023-05-16 DIAGNOSIS — Z20822 Contact with and (suspected) exposure to covid-19: Secondary | ICD-10-CM | POA: Insufficient documentation

## 2023-05-16 DIAGNOSIS — M549 Dorsalgia, unspecified: Secondary | ICD-10-CM | POA: Diagnosis not present

## 2023-05-16 DIAGNOSIS — R197 Diarrhea, unspecified: Secondary | ICD-10-CM | POA: Diagnosis not present

## 2023-05-16 DIAGNOSIS — R5381 Other malaise: Secondary | ICD-10-CM | POA: Insufficient documentation

## 2023-05-16 DIAGNOSIS — R112 Nausea with vomiting, unspecified: Secondary | ICD-10-CM | POA: Insufficient documentation

## 2023-05-16 DIAGNOSIS — R509 Fever, unspecified: Secondary | ICD-10-CM | POA: Diagnosis not present

## 2023-05-16 LAB — COMPREHENSIVE METABOLIC PANEL
ALT: 37 U/L (ref 0–44)
AST: 34 U/L (ref 15–41)
Albumin: 4 g/dL (ref 3.5–5.0)
Alkaline Phosphatase: 48 U/L (ref 38–126)
Anion gap: 7 (ref 5–15)
BUN: 17 mg/dL (ref 6–20)
CO2: 23 mmol/L (ref 22–32)
Calcium: 9 mg/dL (ref 8.9–10.3)
Chloride: 107 mmol/L (ref 98–111)
Creatinine, Ser: 0.96 mg/dL (ref 0.61–1.24)
GFR, Estimated: >60 mL/min (ref >60)
Glucose, Bld: 104 mg/dL — ABNORMAL HIGH (ref 70–99)
Potassium: 4.4 mmol/L (ref 3.5–5.1)
Sodium: 137 mmol/L (ref 135–145)
Total Bilirubin: 0.5 mg/dL (ref <1.2)
Total Protein: 6.6 g/dL (ref 6.5–8.1)

## 2023-05-16 LAB — URINALYSIS, ROUTINE W REFLEX MICROSCOPIC
Bilirubin Urine: NEGATIVE
Glucose, UA: NEGATIVE mg/dL
Hgb urine dipstick: NEGATIVE
Ketones, ur: NEGATIVE mg/dL
Leukocytes,Ua: NEGATIVE
Nitrite: NEGATIVE
Protein, ur: NEGATIVE mg/dL
Specific Gravity, Urine: 1.017 (ref 1.005–1.030)
pH: 5 (ref 5.0–8.0)

## 2023-05-16 LAB — CBC
HCT: 45.7 % (ref 39.0–52.0)
Hemoglobin: 15.6 g/dL (ref 13.0–17.0)
MCH: 30.1 pg (ref 26.0–34.0)
MCHC: 34.1 g/dL (ref 30.0–36.0)
MCV: 88.2 fL (ref 80.0–100.0)
Platelets: 292 10*3/uL (ref 150–400)
RBC: 5.18 MIL/uL (ref 4.22–5.81)
RDW: 12.2 % (ref 11.5–15.5)
WBC: 6.8 10*3/uL (ref 4.0–10.5)
nRBC: 0 % (ref 0.0–0.2)

## 2023-05-16 LAB — RESP PANEL BY RT-PCR (RSV, FLU A&B, COVID)  RVPGX2
Influenza A by PCR: NEGATIVE
Influenza B by PCR: NEGATIVE
Resp Syncytial Virus by PCR: NEGATIVE
SARS Coronavirus 2 by RT PCR: NEGATIVE

## 2023-05-16 LAB — LIPASE, BLOOD: Lipase: 31 U/L (ref 11–51)

## 2023-05-16 LAB — SARS CORONAVIRUS 2 BY RT PCR: SARS Coronavirus 2 by RT PCR: NEGATIVE

## 2023-05-16 MED ORDER — ONDANSETRON 4 MG PO TBDP
4.0000 mg | ORAL_TABLET | Freq: Once | ORAL | Status: AC
  Administered 2023-05-16: 4 mg via ORAL
  Filled 2023-05-16: qty 1

## 2023-05-16 MED ORDER — OSELTAMIVIR PHOSPHATE 75 MG PO CAPS: 75.0000 mg | ORAL_CAPSULE | Freq: Two times a day (BID) | ORAL | 0 refills | Status: AC

## 2023-05-16 MED ORDER — ONDANSETRON 4 MG PO TBDP: 4.0000 mg | ORAL_TABLET | Freq: Three times a day (TID) | ORAL | 0 refills | Status: AC | PRN

## 2023-05-16 NOTE — Discharge Instructions (Addendum)
Your lab work is reassuring today.  Your electrolytes were all normal.  Your kidney, liver, and pancreas labs are normal.  Your urine did not show any signs of infection.  Your COVID was negative.  The results of your flu test are still pending.  Please review the results on your MyChart portal. If the flu test is positive, please take the Tamiflu medication as prescribed. If the test is negative, do not take the Tamiflu.  You have been prescribed the nausea medication you were given here, Zofran, to use as needed for nausea and vomiting.  You may take this up to every 8 hours as needed for nausea and vomiting.   Return to the ER for any uncontrolled vomiting, fever that is not controlled with Tylenol, any other new or concerning symptoms.

## 2023-05-16 NOTE — ED Provider Notes (Signed)
Patient not in room.   Terald Sleeper, MD 05/16/23 [phone]

## 2023-05-16 NOTE — ED Provider Notes (Signed)
[place] EMERGENCY DEPARTMENT AT [facility] Provider Note   CSN: [id number] Arrival date & time: 05/16/23  0701     History  Chief Complaint  Patient presents with   Nausea   Emesis   Back Pain   Diarrhea    [name] is a 22 y.o. male with no significant past medical history presents with concern for general malaise, nausea, vomiting, over the last 3 days.  States he has had family members who have been sick with the flu.  He reports subjective fever and chills.  Denies any cough, abdominal pain, diarrhea, hematuria, dysuria.   Emesis Associated symptoms: diarrhea   Back Pain Diarrhea Associated symptoms: vomiting        Home Medications Prior to Admission medications   Medication Sig Start Date End Date Taking? Authorizing Provider  ondansetron (ZOFRAN-ODT) 4 MG disintegrating tablet Take 1 tablet (4 mg total) by mouth every 8 (eight) hours as needed for nausea or vomiting. 05/16/23  Yes Arabella Merles, PA-C  oseltamivir (TAMIFLU) 75 MG capsule Take 1 capsule (75 mg total) by mouth every 12 (twelve) hours. 05/16/23  Yes Arabella Merles, PA-C      Allergies    Patient has no known allergies.    Review of Systems   Review of Systems  Gastrointestinal:  Positive for diarrhea and vomiting.  Musculoskeletal:  Positive for back pain.    Physical Exam Updated Vital Signs BP (!) 140/79 (BP Location: Right Arm)   Pulse 80   Temp 98 F (36.7 C) (Oral)   Resp 17   Ht 5\' 4"  (1.626 m)   Wt 61.2 kg   SpO2 99%   BMI 23.17 kg/m  Physical Exam Vitals and nursing note reviewed.  Constitutional:      General: He is not in acute distress.    Appearance: He is well-developed.     Comments: No active emesis  HENT:     Head: Normocephalic and atraumatic.  Eyes:     Conjunctiva/sclera: Conjunctivae normal.  Cardiovascular:     Rate and Rhythm: Normal rate and regular rhythm.     Heart sounds: No murmur heard. Pulmonary:     Effort: Pulmonary  effort is normal. No respiratory distress.     Breath sounds: Normal breath sounds.  Abdominal:     Palpations: Abdomen is soft.     Tenderness: There is no abdominal tenderness.  Musculoskeletal:        General: No swelling.     Cervical back: Neck supple.  Skin:    General: Skin is warm and dry.     Capillary Refill: Capillary refill takes less than 2 seconds.  Neurological:     Mental Status: He is alert.  Psychiatric:        Mood and Affect: Mood normal.     ED Results / Procedures / Treatments   Labs (all labs ordered are listed, but only abnormal results are displayed) Labs Reviewed  COMPREHENSIVE METABOLIC PANEL - Abnormal; Notable for the following components:      Result Value   Glucose, Bld 104 (*)    All other components within normal limits  SARS CORONAVIRUS 2 BY RT PCR  RESP PANEL BY RT-PCR (RSV, FLU A&B, COVID)  [id number]  LIPASE, BLOOD  CBC  URINALYSIS, ROUTINE W REFLEX MICROSCOPIC    EKG None  Radiology No results found.  Procedures Procedures    Medications Ordered in ED Medications  ondansetron (ZOFRAN-ODT) disintegrating tablet 4 mg (4 mg  Oral Given 05/16/23 1015)    ED Course/ Medical Decision Making/ A&P                                 Medical Decision Making Amount and/or Complexity of Data Reviewed Labs: ordered.  Risk Prescription drug management.     Differential diagnosis includes but is not limited to COVID, flu, RSV, viral URI, strep pharyngitis, viral pharyngitis, allergic rhinitis, pneumonia, bronchitis   ED Course:  Patient well-appearing, no active emesis.  Afebrile, not tachycardic.  CBC without any leukocytosis.  CMP without any electrolyte abnormalities.  No elevation in LFTs.  Lipase within normal limits.  COVID-negative.  Urinalysis unremarkable.  No concern for any acute abdominal pathology at this time, no further imaging indicated.  Patient given Zofran for nausea.  Additional swab to test for flu was obtained due  to patient's recent flu exposure and complaints of nausea and vomiting.  I suspect patient's symptoms are likely due to a viral URI or flu.  Upon re-evaluation, patient states he feels much better with the Zofran.  His flu test is still in process.  I discussed with patient that he can review the results on his MyChart portal at home.  I have sent in a prescription for Tamiflu to his pharmacy.  If the flu test is positive, he may take the medication as prescribed.  If the flu test is negative, he understands not to take the Tamiflu.  He was also prescribed Zofran to use as needed for nausea and vomiting.  Patient appropriate for discharge home at this time.  Strict return precautions given.  Impression: Nausea and vomiting                Final Clinical Impression(s) / ED Diagnoses Final diagnoses:  Nausea and vomiting, unspecified vomiting type    Rx / DC Orders ED Discharge Orders          Ordered    ondansetron (ZOFRAN-ODT) 4 MG disintegrating tablet  Every 8 hours PRN        05/16/23 1102    oseltamivir (TAMIFLU) 75 MG capsule  Every 12 hours        05/16/23 1102              Arabella Merles, PA-C 05/16/23 1105    Terald Sleeper, MD 05/16/23 1254

## 2023-05-16 NOTE — ED Triage Notes (Signed)
Pt. Stated, [name] been N/V/D and back pain for 2 days. I need to be tested for everything.
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
## 2023-09-16 DIAGNOSIS — Z202 Contact with and (suspected) exposure to infections with a predominantly sexual mode of transmission: Secondary | ICD-10-CM | POA: Diagnosis not present
